# Patient Record
Sex: Male | Born: 1945
Health system: Southern US, Community
[De-identification: ages and names within clinical notes are randomized; demographics above are authoritative.]

## PROBLEM LIST (undated history)

## (undated) DIAGNOSIS — J189 Pneumonia, unspecified organism: Secondary | ICD-10-CM

## (undated) DIAGNOSIS — K219 Gastro-esophageal reflux disease without esophagitis: Secondary | ICD-10-CM

## (undated) DIAGNOSIS — E785 Hyperlipidemia, unspecified: Secondary | ICD-10-CM

## (undated) DIAGNOSIS — J449 Chronic obstructive pulmonary disease, unspecified: Secondary | ICD-10-CM

## (undated) DIAGNOSIS — I340 Nonrheumatic mitral (valve) insufficiency: Secondary | ICD-10-CM

## (undated) DIAGNOSIS — I4891 Unspecified atrial fibrillation: Secondary | ICD-10-CM

## (undated) DIAGNOSIS — C3490 Malignant neoplasm of unspecified part of unspecified bronchus or lung: Secondary | ICD-10-CM

## (undated) DIAGNOSIS — I509 Heart failure, unspecified: Secondary | ICD-10-CM

## (undated) HISTORY — PX: ABDOMINAL SURGERY: SHX537

---

## 2019-05-04 ENCOUNTER — Inpatient Hospital Stay (HOSPITAL_COMMUNITY)
Admission: EM | Admit: 2019-05-04 | Discharge: 2019-05-09 | DRG: 189 | Disposition: A | Discharge status: Hospice | Attending: Student | Admitting: Student

## 2019-05-04 DIAGNOSIS — J9601 Acute respiratory failure with hypoxia: Secondary | ICD-10-CM | POA: Diagnosis present

## 2019-05-04 DIAGNOSIS — J189 Pneumonia, unspecified organism: Secondary | ICD-10-CM | POA: Diagnosis present

## 2019-05-04 DIAGNOSIS — D631 Anemia in chronic kidney disease: Secondary | ICD-10-CM | POA: Diagnosis present

## 2019-05-04 DIAGNOSIS — Z79899 Other long term (current) drug therapy: Secondary | ICD-10-CM

## 2019-05-04 DIAGNOSIS — C7931 Secondary malignant neoplasm of brain: Secondary | ICD-10-CM | POA: Diagnosis present

## 2019-05-04 DIAGNOSIS — I248 Other forms of acute ischemic heart disease: Secondary | ICD-10-CM | POA: Diagnosis present

## 2019-05-04 DIAGNOSIS — Z87891 Personal history of nicotine dependence: Secondary | ICD-10-CM

## 2019-05-04 DIAGNOSIS — R7303 Prediabetes: Secondary | ICD-10-CM | POA: Diagnosis present

## 2019-05-04 DIAGNOSIS — E876 Hypokalemia: Secondary | ICD-10-CM | POA: Diagnosis not present

## 2019-05-04 DIAGNOSIS — Z515 Encounter for palliative care: Secondary | ICD-10-CM | POA: Diagnosis present

## 2019-05-04 DIAGNOSIS — R0602 Shortness of breath: Secondary | ICD-10-CM | POA: Diagnosis not present

## 2019-05-04 DIAGNOSIS — Z833 Family history of diabetes mellitus: Secondary | ICD-10-CM

## 2019-05-04 DIAGNOSIS — Z681 Body mass index (BMI) 19 or less, adult: Secondary | ICD-10-CM

## 2019-05-04 DIAGNOSIS — Z7902 Long term (current) use of antithrombotics/antiplatelets: Secondary | ICD-10-CM

## 2019-05-04 DIAGNOSIS — C3490 Malignant neoplasm of unspecified part of unspecified bronchus or lung: Secondary | ICD-10-CM | POA: Diagnosis present

## 2019-05-04 DIAGNOSIS — Z9981 Dependence on supplemental oxygen: Secondary | ICD-10-CM

## 2019-05-04 DIAGNOSIS — Z888 Allergy status to other drugs, medicaments and biological substances status: Secondary | ICD-10-CM

## 2019-05-04 DIAGNOSIS — N289 Disorder of kidney and ureter, unspecified: Secondary | ICD-10-CM

## 2019-05-04 DIAGNOSIS — D7589 Other specified diseases of blood and blood-forming organs: Secondary | ICD-10-CM | POA: Diagnosis present

## 2019-05-04 DIAGNOSIS — I5043 Acute on chronic combined systolic (congestive) and diastolic (congestive) heart failure: Secondary | ICD-10-CM | POA: Diagnosis present

## 2019-05-04 DIAGNOSIS — I480 Paroxysmal atrial fibrillation: Secondary | ICD-10-CM | POA: Diagnosis present

## 2019-05-04 DIAGNOSIS — Z880 Allergy status to penicillin: Secondary | ICD-10-CM

## 2019-05-04 DIAGNOSIS — R7989 Other specified abnormal findings of blood chemistry: Secondary | ICD-10-CM | POA: Diagnosis present

## 2019-05-04 DIAGNOSIS — Z66 Do not resuscitate: Secondary | ICD-10-CM

## 2019-05-04 DIAGNOSIS — D539 Nutritional anemia, unspecified: Secondary | ICD-10-CM | POA: Diagnosis present

## 2019-05-04 DIAGNOSIS — E785 Hyperlipidemia, unspecified: Secondary | ICD-10-CM | POA: Diagnosis present

## 2019-05-04 DIAGNOSIS — N1831 Chronic kidney disease, stage 3a: Secondary | ICD-10-CM | POA: Diagnosis present

## 2019-05-04 DIAGNOSIS — K219 Gastro-esophageal reflux disease without esophagitis: Secondary | ICD-10-CM | POA: Diagnosis present

## 2019-05-04 DIAGNOSIS — R64 Cachexia: Secondary | ICD-10-CM | POA: Diagnosis present

## 2019-05-04 DIAGNOSIS — M109 Gout, unspecified: Secondary | ICD-10-CM | POA: Diagnosis present

## 2019-05-04 DIAGNOSIS — Z8249 Family history of ischemic heart disease and other diseases of the circulatory system: Secondary | ICD-10-CM

## 2019-05-04 DIAGNOSIS — R778 Other specified abnormalities of plasma proteins: Secondary | ICD-10-CM

## 2019-05-04 DIAGNOSIS — J9621 Acute and chronic respiratory failure with hypoxia: Secondary | ICD-10-CM | POA: Diagnosis not present

## 2019-05-04 DIAGNOSIS — Z20822 Contact with and (suspected) exposure to covid-19: Secondary | ICD-10-CM | POA: Diagnosis present

## 2019-05-04 DIAGNOSIS — E43 Unspecified severe protein-calorie malnutrition: Secondary | ICD-10-CM | POA: Insufficient documentation

## 2019-05-04 DIAGNOSIS — J440 Chronic obstructive pulmonary disease with acute lower respiratory infection: Secondary | ICD-10-CM | POA: Diagnosis present

## 2019-05-04 DIAGNOSIS — N179 Acute kidney failure, unspecified: Secondary | ICD-10-CM | POA: Diagnosis present

## 2019-05-04 DIAGNOSIS — I712 Thoracic aortic aneurysm, without rupture: Secondary | ICD-10-CM | POA: Diagnosis present

## 2019-05-04 DIAGNOSIS — R079 Chest pain, unspecified: Secondary | ICD-10-CM | POA: Diagnosis present

## 2019-05-04 DIAGNOSIS — J441 Chronic obstructive pulmonary disease with (acute) exacerbation: Secondary | ICD-10-CM | POA: Diagnosis present

## 2019-05-04 DIAGNOSIS — I130 Hypertensive heart and chronic kidney disease with heart failure and stage 1 through stage 4 chronic kidney disease, or unspecified chronic kidney disease: Secondary | ICD-10-CM | POA: Diagnosis present

## 2019-05-04 HISTORY — DX: Chronic obstructive pulmonary disease, unspecified: J44.9

## 2019-05-04 HISTORY — DX: Nonrheumatic mitral (valve) insufficiency: I34.0

## 2019-05-04 HISTORY — DX: Malignant neoplasm of unspecified part of unspecified bronchus or lung: C34.90

## 2019-05-04 HISTORY — DX: Heart failure, unspecified: I50.9

## 2019-05-04 HISTORY — DX: Unspecified atrial fibrillation: I48.91

## 2019-05-04 HISTORY — DX: Hyperlipidemia, unspecified: E78.5

## 2019-05-04 HISTORY — DX: Gastro-esophageal reflux disease without esophagitis: K21.9

## 2019-05-04 HISTORY — DX: Pneumonia, unspecified organism: J18.9

## 2019-05-04 NOTE — ED Triage Notes (Signed)
Pt arrives via [facility] from home.   Pt is a hospice pt, family called about for acute SOB about 1hr ago.  Pt placed on CPAP sp02% 88%.  Pt given 2g of Mag, 125 of solumedrol, 0.3 mg of epi EPI, and a duoneb of 5 albuterol and 0.5 atrovent.   Pt arrives on NRB.   Pt speaks no english.

## 2019-05-04 NOTE — ED Provider Notes (Signed)
[facility] EMERGENCY DEPARTMENT Provider Note   CSN: [id number] Arrival date & time: 05/04/19  2324   History Chief Complaint  Patient presents with  . Shortness of Breath    Ross Roberts is a 74 y.o. male.  The history is provided by a relative. The history is limited by the condition of the patient (Acuity of condition).  Shortness of Breath He has history of COPD and lung cancer currently under hospice care.  Tonight, he appeared to aspirate while drinking some soda.  His son noted he normally spits a lot of stuff out and he was not spitting anything out.  He does seem to be somewhat better since arriving in the hospital.  He has lost approximately 20pounds.  He is DNR, but they have not discussed intubation and ventilator use.  No past medical history on file.  There are no problems to display for this patient.   ** The histories are not reviewed yet. Please review them in the "History" navigator section and refresh this [place].     No family history on file.  Social History   Tobacco Use  . Smoking status: Not on file  Substance Use Topics  . Alcohol use: Not on file  . Drug use: Not on file    Home Medications Prior to Admission medications   Not on File    Allergies    Patient has no allergy information on record.  Review of Systems   Review of Systems  Unable to perform ROS: Acuity of condition  Respiratory: Positive for shortness of breath.     Physical Exam Updated Vital Signs BP (!) 160/80   Pulse 95   Temp (!) 96.8 F (36 C) (Axillary)   Resp (!) 27   SpO2 98%   Physical Exam Vitals and nursing note reviewed.   Cachectic 74 year old male in mild to moderate respiratory distress. Vital signs are significant for elevated respiratory rate and elevated systolic blood pressure. Oxygen saturation is 98%, which is normal. Head is normocephalic and atraumatic. PERRLA, EOMI. Oropharynx is clear. Neck is nontender and  supple without adenopathy. JVD is present at 90 degrees. Back is nontender and there is no CVA tenderness. Lungs have diffuse inspiratory and expiratory rhonchi.  Breath sounds on the right are diminished compared with the left.  Rales are noted at the left base. Chest is nontender. Heart has regular rate and rhythm without murmur. Abdomen is soft, flat, nontender without masses or hepatosplenomegaly and peristalsis is normoactive. Extremities have no cyanosis or edema, full range of motion is present. Skin is warm and dry without rash. Neurologic: Mental status is normal, cranial nerves are intact, there are no motor or sensory deficits.  ED Results / Procedures / Treatments   Labs (all labs ordered are listed, but only abnormal results are displayed) Labs Reviewed - No data to display  EKG None  Radiology No results found.  Procedures Procedures (including critical care time)  Medications Ordered in ED Medications - No data to display  ED Course  I have reviewed the triage vital signs and the nursing notes.  Pertinent labs & imaging results that were available during my care of the patient were reviewed by me and considered in my medical decision making (see chart for details).  MDM Rules/Calculators/A&P Acute respiratory distress probably related to aspiration in setting of lung cancer.  Also, physical findings suggestive of heart failure.  He has no old records in the [facility] health system,  and no records are available through care everywhere, even though his original cancer diagnosis was through [facility] in [place].  Will check chest x-ray and screening labs.  ECG shows no acute changes.  After arrival in the ED, patient appeared stable.  It was taken off of his nonrebreather mask and put on nasal cannula at 4 L/min.  He maintain adequate oxygen saturation with this and then was reduced to 2 L/min.  Per his son, he uses an oxygen concentrator at home for oxygen at 2  L/min.  Chest x-ray is read as showing pneumonia.  However, patient does not clinically have pneumonia.  No antibiotics are initiated.  Labs show mild renal insufficiency, no prior results available.  Macrocytosis also identified.  BNP is normal but troponin is mildly elevated at 112.  He was kept in the ED for repeat troponin which has increased to 698.  He will need to be admitted for observation and trend troponins.  Case is discussed with Dr. Maudie Mercury of [facility] hospitalist, who agrees to admit the patient.  Final Clinical Impression(s) / ED Diagnoses Final diagnoses:  Shortness of breath  Elevated troponin  Renal insufficiency  Hypermagnesemia  Macrocytosis without anemia    Rx / DC Orders ED Discharge Orders    None       Delora Fuel, MD 90/90/30 [phone]

## 2019-05-05 ENCOUNTER — Observation Stay (HOSPITAL_BASED_OUTPATIENT_CLINIC_OR_DEPARTMENT_OTHER)

## 2019-05-05 ENCOUNTER — Other Ambulatory Visit: Payer: Self-pay

## 2019-05-05 ENCOUNTER — Encounter (HOSPITAL_COMMUNITY): Payer: Self-pay | Admitting: Internal Medicine

## 2019-05-05 ENCOUNTER — Emergency Department (HOSPITAL_COMMUNITY)

## 2019-05-05 DIAGNOSIS — N1831 Chronic kidney disease, stage 3a: Secondary | ICD-10-CM | POA: Diagnosis present

## 2019-05-05 DIAGNOSIS — R079 Chest pain, unspecified: Secondary | ICD-10-CM | POA: Diagnosis present

## 2019-05-05 DIAGNOSIS — R0602 Shortness of breath: Secondary | ICD-10-CM | POA: Diagnosis present

## 2019-05-05 DIAGNOSIS — I130 Hypertensive heart and chronic kidney disease with heart failure and stage 1 through stage 4 chronic kidney disease, or unspecified chronic kidney disease: Secondary | ICD-10-CM | POA: Diagnosis present

## 2019-05-05 DIAGNOSIS — Z66 Do not resuscitate: Secondary | ICD-10-CM | POA: Diagnosis present

## 2019-05-05 DIAGNOSIS — I519 Heart disease, unspecified: Secondary | ICD-10-CM

## 2019-05-05 DIAGNOSIS — J9601 Acute respiratory failure with hypoxia: Secondary | ICD-10-CM | POA: Diagnosis not present

## 2019-05-05 DIAGNOSIS — R64 Cachexia: Secondary | ICD-10-CM | POA: Diagnosis present

## 2019-05-05 DIAGNOSIS — R7989 Other specified abnormal findings of blood chemistry: Secondary | ICD-10-CM | POA: Diagnosis present

## 2019-05-05 DIAGNOSIS — J441 Chronic obstructive pulmonary disease with (acute) exacerbation: Secondary | ICD-10-CM | POA: Diagnosis present

## 2019-05-05 DIAGNOSIS — J9621 Acute and chronic respiratory failure with hypoxia: Secondary | ICD-10-CM | POA: Diagnosis present

## 2019-05-05 DIAGNOSIS — Z20822 Contact with and (suspected) exposure to covid-19: Secondary | ICD-10-CM | POA: Diagnosis present

## 2019-05-05 DIAGNOSIS — N289 Disorder of kidney and ureter, unspecified: Secondary | ICD-10-CM | POA: Diagnosis not present

## 2019-05-05 DIAGNOSIS — J189 Pneumonia, unspecified organism: Secondary | ICD-10-CM

## 2019-05-05 DIAGNOSIS — I5043 Acute on chronic combined systolic (congestive) and diastolic (congestive) heart failure: Secondary | ICD-10-CM | POA: Diagnosis present

## 2019-05-05 DIAGNOSIS — C7931 Secondary malignant neoplasm of brain: Secondary | ICD-10-CM | POA: Diagnosis present

## 2019-05-05 DIAGNOSIS — Z515 Encounter for palliative care: Secondary | ICD-10-CM | POA: Diagnosis present

## 2019-05-05 DIAGNOSIS — D631 Anemia in chronic kidney disease: Secondary | ICD-10-CM | POA: Diagnosis present

## 2019-05-05 DIAGNOSIS — R778 Other specified abnormalities of plasma proteins: Secondary | ICD-10-CM | POA: Diagnosis not present

## 2019-05-05 DIAGNOSIS — N179 Acute kidney failure, unspecified: Secondary | ICD-10-CM | POA: Diagnosis present

## 2019-05-05 DIAGNOSIS — I480 Paroxysmal atrial fibrillation: Secondary | ICD-10-CM | POA: Diagnosis present

## 2019-05-05 DIAGNOSIS — C3490 Malignant neoplasm of unspecified part of unspecified bronchus or lung: Secondary | ICD-10-CM | POA: Diagnosis present

## 2019-05-05 DIAGNOSIS — I248 Other forms of acute ischemic heart disease: Secondary | ICD-10-CM | POA: Diagnosis present

## 2019-05-05 DIAGNOSIS — E43 Unspecified severe protein-calorie malnutrition: Secondary | ICD-10-CM | POA: Diagnosis present

## 2019-05-05 DIAGNOSIS — Z681 Body mass index (BMI) 19 or less, adult: Secondary | ICD-10-CM | POA: Diagnosis not present

## 2019-05-05 DIAGNOSIS — Z9981 Dependence on supplemental oxygen: Secondary | ICD-10-CM | POA: Diagnosis not present

## 2019-05-05 DIAGNOSIS — J440 Chronic obstructive pulmonary disease with acute lower respiratory infection: Secondary | ICD-10-CM | POA: Diagnosis present

## 2019-05-05 DIAGNOSIS — D7589 Other specified diseases of blood and blood-forming organs: Secondary | ICD-10-CM | POA: Diagnosis present

## 2019-05-05 DIAGNOSIS — I712 Thoracic aortic aneurysm, without rupture: Secondary | ICD-10-CM | POA: Diagnosis present

## 2019-05-05 DIAGNOSIS — K219 Gastro-esophageal reflux disease without esophagitis: Secondary | ICD-10-CM | POA: Diagnosis present

## 2019-05-05 LAB — CBC WITH DIFFERENTIAL/PLATELET
Abs Immature Granulocytes: 0.05 10*3/uL (ref 0.00–0.07)
Basophils Absolute: 0.1 10*3/uL (ref 0.0–0.1)
Basophils Relative: 1 %
Eosinophils Absolute: 1.8 10*3/uL — ABNORMAL HIGH (ref 0.0–0.5)
Eosinophils Relative: 13 %
HCT: 39.8 % (ref 39.0–52.0)
Hemoglobin: 12.9 g/dL — ABNORMAL LOW (ref 13.0–17.0)
Immature Granulocytes: 0 %
Lymphocytes Relative: 35 %
Lymphs Abs: 4.7 10*3/uL — ABNORMAL HIGH (ref 0.7–4.0)
MCH: 33.4 pg (ref 26.0–34.0)
MCHC: 32.4 g/dL (ref 30.0–36.0)
MCV: 103.1 fL — ABNORMAL HIGH (ref 80.0–100.0)
Monocytes Absolute: 1.1 10*3/uL — ABNORMAL HIGH (ref 0.1–1.0)
Monocytes Relative: 8 %
Neutro Abs: 5.7 10*3/uL (ref 1.7–7.7)
Neutrophils Relative %: 43 %
Platelets: 347 10*3/uL (ref 150–400)
RBC: 3.86 MIL/uL — ABNORMAL LOW (ref 4.22–5.81)
RDW: 14.8 % (ref 11.5–15.5)
WBC: 13.4 10*3/uL — ABNORMAL HIGH (ref 4.0–10.5)
nRBC: 0 % (ref 0.0–0.2)

## 2019-05-05 LAB — COMPREHENSIVE METABOLIC PANEL
ALT: 14 U/L (ref 0–44)
ALT: 13 U/L (ref 0–44)
AST: 33 U/L (ref 15–41)
AST: 24 U/L (ref 15–41)
Albumin: 3.3 g/dL — ABNORMAL LOW (ref 3.5–5.0)
Albumin: 2.9 g/dL — ABNORMAL LOW (ref 3.5–5.0)
Alkaline Phosphatase: 70 U/L (ref 38–126)
Alkaline Phosphatase: 55 U/L (ref 38–126)
Anion gap: 14 (ref 5–15)
Anion gap: 13 (ref 5–15)
BUN: 27 mg/dL — ABNORMAL HIGH (ref 8–23)
BUN: 28 mg/dL — ABNORMAL HIGH (ref 8–23)
CO2: 27 mmol/L (ref 22–32)
CO2: 23 mmol/L (ref 22–32)
Calcium: 9 mg/dL (ref 8.9–10.3)
Calcium: 8.6 mg/dL — ABNORMAL LOW (ref 8.9–10.3)
Chloride: 99 mmol/L (ref 98–111)
Chloride: 103 mmol/L (ref 98–111)
Creatinine, Ser: 1.81 mg/dL — ABNORMAL HIGH (ref 0.61–1.24)
Creatinine, Ser: 1.81 mg/dL — ABNORMAL HIGH (ref 0.61–1.24)
GFR calc Af Amer: 42 mL/min — ABNORMAL LOW (ref >60)
GFR calc Af Amer: 42 mL/min — ABNORMAL LOW (ref >60)
GFR calc non Af Amer: 36 mL/min — ABNORMAL LOW (ref >60)
GFR calc non Af Amer: 36 mL/min — ABNORMAL LOW (ref >60)
Glucose, Bld: 170 mg/dL — ABNORMAL HIGH (ref 70–99)
Glucose, Bld: 140 mg/dL — ABNORMAL HIGH (ref 70–99)
Potassium: 3.7 mmol/L (ref 3.5–5.1)
Potassium: 3.9 mmol/L (ref 3.5–5.1)
Sodium: 140 mmol/L (ref 135–145)
Sodium: 139 mmol/L (ref 135–145)
Total Bilirubin: 0.9 mg/dL (ref 0.3–1.2)
Total Bilirubin: 0.7 mg/dL (ref 0.3–1.2)
Total Protein: 6.5 g/dL (ref 6.5–8.1)
Total Protein: 6 g/dL — ABNORMAL LOW (ref 6.5–8.1)

## 2019-05-05 LAB — RESPIRATORY PANEL BY RT PCR (FLU A&B, COVID)
Influenza A by PCR: NEGATIVE
Influenza B by PCR: NEGATIVE
SARS Coronavirus 2 by RT PCR: NEGATIVE

## 2019-05-05 LAB — CBC
HCT: 36.7 % — ABNORMAL LOW (ref 39.0–52.0)
Hemoglobin: 11.8 g/dL — ABNORMAL LOW (ref 13.0–17.0)
MCH: 33 pg (ref 26.0–34.0)
MCHC: 32.2 g/dL (ref 30.0–36.0)
MCV: 102.5 fL — ABNORMAL HIGH (ref 80.0–100.0)
Platelets: 271 10*3/uL (ref 150–400)
RBC: 3.58 MIL/uL — ABNORMAL LOW (ref 4.22–5.81)
RDW: 14.6 % (ref 11.5–15.5)
WBC: 11 10*3/uL — ABNORMAL HIGH (ref 4.0–10.5)
nRBC: 0 % (ref 0.0–0.2)

## 2019-05-05 LAB — LIPID PANEL
Cholesterol: 185 mg/dL (ref 0–200)
HDL: 41 mg/dL (ref >40)
LDL Cholesterol: 128 mg/dL — ABNORMAL HIGH (ref 0–99)
Total CHOL/HDL Ratio: 4.5 RATIO
Triglycerides: 78 mg/dL (ref <150)
VLDL: 16 mg/dL (ref 0–40)

## 2019-05-05 LAB — CK TOTAL AND CKMB (NOT AT ARMC)
CK, MB: 2.7 ng/mL (ref 0.5–5.0)
Relative Index: INVALID (ref 0.0–2.5)
Total CK: 50 U/L (ref 49–397)

## 2019-05-05 LAB — ECHOCARDIOGRAM COMPLETE

## 2019-05-05 LAB — TROPONIN I (HIGH SENSITIVITY)
Troponin I (High Sensitivity): 112 ng/L (ref <18)
Troponin I (High Sensitivity): 698 ng/L (ref <18)
Troponin I (High Sensitivity): 763 ng/L (ref <18)

## 2019-05-05 LAB — HEMOGLOBIN A1C
Hgb A1c MFr Bld: 6.2 % — ABNORMAL HIGH (ref 4.8–5.6)
Mean Plasma Glucose: 131.24 mg/dL

## 2019-05-05 LAB — MAGNESIUM: Magnesium: 3.5 mg/dL — ABNORMAL HIGH (ref 1.7–2.4)

## 2019-05-05 LAB — BRAIN NATRIURETIC PEPTIDE: B Natriuretic Peptide: 69.5 pg/mL (ref 0.0–100.0)

## 2019-05-05 LAB — HIV ANTIBODY (ROUTINE TESTING W REFLEX): HIV Screen 4th Generation wRfx: NONREACTIVE

## 2019-05-05 MED ORDER — VANCOMYCIN HCL 750 MG/150ML IV SOLN
750.0000 mg | INTRAVENOUS | Status: DC
  Administered 2019-05-05 – 2019-05-07 (×2): 750 mg via INTRAVENOUS
  Filled 2019-05-05 (×2): qty 150

## 2019-05-05 MED ORDER — LORAZEPAM 0.5 MG PO TABS: 0.5000 mg | ORAL_TABLET | ORAL | Status: DC | PRN

## 2019-05-05 MED ORDER — SODIUM CHLORIDE 0.9 % IV SOLN: INTRAVENOUS | Status: DC

## 2019-05-05 MED ORDER — ALBUTEROL SULFATE (2.5 MG/3ML) 0.083% IN NEBU
2.5000 mg | INHALATION_SOLUTION | RESPIRATORY_TRACT | Status: DC | PRN
  Administered 2019-05-05 – 2019-05-07 (×7): 2.5 mg via RESPIRATORY_TRACT
  Filled 2019-05-05 (×7): qty 3

## 2019-05-05 MED ORDER — FAMOTIDINE 20 MG PO TABS
20.0000 mg | ORAL_TABLET | Freq: Every day | ORAL | Status: DC
  Administered 2019-05-05 – 2019-05-08 (×4): 20 mg via ORAL
  Filled 2019-05-05 (×4): qty 1

## 2019-05-05 MED ORDER — FUROSEMIDE 40 MG PO TABS
40.0000 mg | ORAL_TABLET | Freq: Every day | ORAL | Status: DC
  Administered 2019-05-05 – 2019-05-06 (×2): 40 mg via ORAL
  Filled 2019-05-05 (×2): qty 1

## 2019-05-05 MED ORDER — GABAPENTIN 100 MG PO CAPS: 100.0000 mg | ORAL_CAPSULE | Freq: Every evening | ORAL | Status: DC | PRN

## 2019-05-05 MED ORDER — NITROGLYCERIN 0.4 MG SL SUBL: 0.4000 mg | SUBLINGUAL_TABLET | SUBLINGUAL | Status: DC | PRN

## 2019-05-05 MED ORDER — ALBUTEROL SULFATE HFA 108 (90 BASE) MCG/ACT IN AERS
2.0000 | INHALATION_SPRAY | RESPIRATORY_TRACT | Status: DC | PRN
  Filled 2019-05-05: qty 6.7

## 2019-05-05 MED ORDER — APIXABAN 2.5 MG PO TABS
2.5000 mg | ORAL_TABLET | Freq: Two times a day (BID) | ORAL | Status: DC
  Administered 2019-05-05 – 2019-05-09 (×9): 2.5 mg via ORAL
  Filled 2019-05-05 (×10): qty 1

## 2019-05-05 MED ORDER — ALLOPURINOL 100 MG PO TABS
100.0000 mg | ORAL_TABLET | Freq: Every day | ORAL | Status: DC
  Administered 2019-05-05 – 2019-05-09 (×5): 100 mg via ORAL
  Filled 2019-05-05 (×6): qty 1

## 2019-05-05 MED ORDER — PANTOPRAZOLE SODIUM 40 MG PO TBEC
40.0000 mg | DELAYED_RELEASE_TABLET | Freq: Every day | ORAL | Status: DC
  Administered 2019-05-05 – 2019-05-09 (×5): 40 mg via ORAL
  Filled 2019-05-05 (×5): qty 1

## 2019-05-05 MED ORDER — SUCRALFATE 1 GM/10ML PO SUSP
1.0000 g | Freq: Three times a day (TID) | ORAL | Status: DC
  Administered 2019-05-05 – 2019-05-09 (×13): 1 g via ORAL
  Filled 2019-05-05 (×14): qty 10

## 2019-05-05 MED ORDER — LEVOFLOXACIN IN D5W 500 MG/100ML IV SOLN
500.0000 mg | INTRAVENOUS | Status: DC
  Administered 2019-05-05: 07:00:00 500 mg via INTRAVENOUS
  Filled 2019-05-05: qty 100

## 2019-05-05 MED ORDER — MIRTAZAPINE 7.5 MG PO TABS
7.5000 mg | ORAL_TABLET | Freq: Every day | ORAL | Status: DC
  Administered 2019-05-05 – 2019-05-08 (×4): 7.5 mg via ORAL
  Filled 2019-05-05 (×5): qty 1

## 2019-05-05 MED ORDER — MONTELUKAST SODIUM 10 MG PO TABS
10.0000 mg | ORAL_TABLET | Freq: Every day | ORAL | Status: DC
  Administered 2019-05-05 – 2019-05-08 (×4): 10 mg via ORAL
  Filled 2019-05-05 (×4): qty 1

## 2019-05-05 MED ORDER — CARVEDILOL 25 MG PO TABS
25.0000 mg | ORAL_TABLET | Freq: Two times a day (BID) | ORAL | Status: DC
  Administered 2019-05-05 – 2019-05-07 (×6): 25 mg via ORAL
  Filled 2019-05-05 (×7): qty 1

## 2019-05-05 MED ORDER — BENZONATATE 100 MG PO CAPS: 100.0000 mg | ORAL_CAPSULE | Freq: Three times a day (TID) | ORAL | Status: DC | PRN

## 2019-05-05 MED ORDER — MORPHINE SULFATE 15 MG PO TABS: 7.5000 mg | ORAL_TABLET | Freq: Three times a day (TID) | ORAL | Status: DC | PRN

## 2019-05-05 NOTE — ED Notes (Signed)
Breakfast ordered 

## 2019-05-05 NOTE — Progress Notes (Signed)
  Echocardiogram 2D Echocardiogram has been performed.  Ross Roberts 05/05/2019, 1:47 PM

## 2019-05-05 NOTE — Progress Notes (Signed)
Care started yesterday in the emergency room patient presented from home via EMS and he was admitted early this morning after midnight by my partner and colleague Dr. Jani Gravel and I am in current agreement with his assessment and plan.  Additional changes of plan of care been made accordingly.  The patient is a 74 year old [place] male who only speaks Telugu who is on hospice currently and does not know that he has lung cancer his family is hid it from him.  His past medical history also includes GERD, hypertension, CKD stage III, moderate mitral regurgitation, chronic systolic CHF with a EF of 40%, thoracic aortic aneurysm, history of COPD on 2 L of home oxygen and other comorbidities who presented with hypoxia.  Patient's son stated that the patient's pulse ox was 88% and he noted a slight chronic cough which is nonproductive.  Patient states that he did develop intermittent chest pain across his chest since December 2020 and he states it worsened yesterday and patient described as a dull pressure with something sitting on his chest.  He presented and he was given 2 mg of magnesium, Solu-Medrol and an epinephrine as well as a DuoNeb enroute.  He was admitted for acute on chronic hypoxic respiratory failure likely secondary to pneumonia as well as chest pain with elevated troponin rule out ACS.    Patient states that he is doing a little bit better now but still has some intermittent chest pain and describe it as "the pain in your little pinky".  His troponins were slightly elevated and worsening in the EKG was done and was relatively unremarkable.  We will order an echocardiogram and discussed with cardiology.  I spoke with Dr. Dani Gobble Croitoru who states that he will evaluate the patient and EKG and wait for the echo results before deciding to formally consult on the patient.  Patient's IV fluids were stopped and will continue his 40 mg of p.o. Lasix for systolic CHF.  Patient is anticoagulated with apixaban  for his proximal atrial fibrillation.  We will continue to treat pneumonia and de-escalate antibiotics as necessary and checking MRSA.  We will continue symptomatic treatment and repeat chest x-ray in the a.m.  We will continue monitoring patient's clinical response to intervention and if his echo is significantly abnormal Cardiology will formally consult and will follow the recommendations.
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
## 2019-05-05 NOTE — Progress Notes (Addendum)
ANTICOAGULATION CONSULT NOTE - Initial Consult  Pharmacy Consult for Apixaban Indication: atrial fibrillation  Allergies  Allergen Reactions  . Hydralazine Hcl Rash  . Isosorbide Rash  . Penicillins Rash    Did it involve swelling of the face/tongue/throat, SOB, or low BP? Unknown Did it involve sudden or severe rash/hives, skin peeling, or any reaction on the inside of your mouth or nose? Yes Did you need to seek medical attention at a hospital or doctor's office? Yes When did it last happen?2015 If all above answers are "NO", may proceed with cephalosporin use.     Vital Signs: Temp: 96.8 F (36 C) (02/13 2332) Temp Source: Axillary (02/13 2332) BP: 110/50 (02/14 0500) Pulse Rate: 67 (02/14 0500)  Labs: Recent Labs    05/04/19 2335 05/05/19 0147  HGB 12.9*  --   HCT 39.8  --   PLT 347  --   CREATININE 1.81*  --   CKTOTAL  --  50  CKMB  --  2.7  TROPONINIHS 112* 698*    CrCl cannot be calculated (Unknown ideal weight.).   Medical History: Past Medical History:  Diagnosis Date  . A-fib ([place])   . CHF (congestive heart failure) (HCC)    EF 40%  . COPD (chronic obstructive pulmonary disease) ([place])   . GERD (gastroesophageal reflux disease)   . Hyperlipidemia   . Lung cancer ([place])    per son  . Mitral regurgitation    moderate  . Pneumonia    post obstructive   Assessment: 74 y/o M with shortness of breath, pt on apixaban PTA for afib, apixaban dose PTA is 5 mg BID. Pt currently meets criteria for dose reduction of 2.5 mg BID based on Scr >1.5 and body weight <60 kg. Hgb 12.9.  Goal of Therapy:  Monitor platelets by anticoagulation protocol: Yes   Plan:  Apixaban 2.5 mg BID Daily CBC while inpatient Monitor for bleeding  Narda Bonds, PharmD, BCPS Clinical Pharmacist Phone: [phone]

## 2019-05-05 NOTE — ED Notes (Signed)
2 attempts for blood draw and successful but veins blown. RN Eugene Garnet informed

## 2019-05-05 NOTE — Progress Notes (Signed)
Chaplain responded to spiritual consult for pt request of  prayer.  Ross Roberts was present with pt and provided translation. Pt said he did not request prayer, but accepted a short blessing nonetheless. Chaplain offered encouragement and emotional support.   Please contact if further services are needed.  Ross Roberts, Sylvia     05/05/19 1617  Clinical Encounter Type  Visited With Patient and family together  Visit Type Initial  Referral From  (Spiritual Consult)  Consult/Referral To Chaplain

## 2019-05-05 NOTE — Progress Notes (Signed)
Pharmacy Antibiotic Note  Ross Roberts is a 74 y.o. male admitted on 05/04/2019 with pneumonia.  Pharmacy has been consulted for Vancomycin/Levaquin dosing. WBC 11. Noted renal dysfunction.  Plan: Vancomycin 750 mg IV q48h >>Estimated AUC:453  Levaquin 500 mg IV q48h Trend WBC, temp, renal function  F/U infectious work-up Drug levels as indicated    Temp (24hrs), Avg:96.8 F (36 C), Min:96.8 F (36 C), Max:96.8 F (36 C)  Recent Labs  Lab 05/04/19 2335 05/05/19 0508  WBC 13.4* 11.0*  CREATININE 1.81*  --     CrCl cannot be calculated (Unknown ideal weight.).    Allergies  Allergen Reactions  . Hydralazine Hcl Rash  . Isosorbide Rash  . Penicillins Rash    Did it involve swelling of the face/tongue/throat, SOB, or low BP? Unknown Did it involve sudden or severe rash/hives, skin peeling, or any reaction on the inside of your mouth or nose? Yes Did you need to seek medical attention at a hospital or doctor's office? Yes When did it last happen?2015 If all above answers are "NO", may proceed with cephalosporin use.     Narda Bonds, PharmD, BCPS Clinical Pharmacist Phone: [phone]
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
## 2019-05-06 ENCOUNTER — Inpatient Hospital Stay (HOSPITAL_COMMUNITY)

## 2019-05-06 DIAGNOSIS — R778 Other specified abnormalities of plasma proteins: Secondary | ICD-10-CM

## 2019-05-06 DIAGNOSIS — N289 Disorder of kidney and ureter, unspecified: Secondary | ICD-10-CM

## 2019-05-06 DIAGNOSIS — R079 Chest pain, unspecified: Secondary | ICD-10-CM

## 2019-05-06 DIAGNOSIS — R0602 Shortness of breath: Secondary | ICD-10-CM

## 2019-05-06 DIAGNOSIS — Z66 Do not resuscitate: Secondary | ICD-10-CM

## 2019-05-06 DIAGNOSIS — E43 Unspecified severe protein-calorie malnutrition: Secondary | ICD-10-CM

## 2019-05-06 LAB — COMPREHENSIVE METABOLIC PANEL
ALT: 13 U/L (ref 0–44)
AST: 19 U/L (ref 15–41)
Albumin: 2.7 g/dL — ABNORMAL LOW (ref 3.5–5.0)
Alkaline Phosphatase: 55 U/L (ref 38–126)
Anion gap: 14 (ref 5–15)
BUN: 39 mg/dL — ABNORMAL HIGH (ref 8–23)
CO2: 24 mmol/L (ref 22–32)
Calcium: 9 mg/dL (ref 8.9–10.3)
Chloride: 105 mmol/L (ref 98–111)
Creatinine, Ser: 1.74 mg/dL — ABNORMAL HIGH (ref 0.61–1.24)
GFR calc Af Amer: 44 mL/min — ABNORMAL LOW (ref >60)
GFR calc non Af Amer: 38 mL/min — ABNORMAL LOW (ref >60)
Glucose, Bld: 146 mg/dL — ABNORMAL HIGH (ref 70–99)
Potassium: 3.8 mmol/L (ref 3.5–5.1)
Sodium: 143 mmol/L (ref 135–145)
Total Bilirubin: 0.6 mg/dL (ref 0.3–1.2)
Total Protein: 5.4 g/dL — ABNORMAL LOW (ref 6.5–8.1)

## 2019-05-06 LAB — CBC WITH DIFFERENTIAL/PLATELET
Abs Immature Granulocytes: 0.08 10*3/uL — ABNORMAL HIGH (ref 0.00–0.07)
Basophils Absolute: 0 10*3/uL (ref 0.0–0.1)
Basophils Relative: 0 %
Eosinophils Absolute: 0 10*3/uL (ref 0.0–0.5)
Eosinophils Relative: 0 %
HCT: 33.5 % — ABNORMAL LOW (ref 39.0–52.0)
Hemoglobin: 10.9 g/dL — ABNORMAL LOW (ref 13.0–17.0)
Immature Granulocytes: 1 %
Lymphocytes Relative: 5 %
Lymphs Abs: 0.8 10*3/uL (ref 0.7–4.0)
MCH: 33 pg (ref 26.0–34.0)
MCHC: 32.5 g/dL (ref 30.0–36.0)
MCV: 101.5 fL — ABNORMAL HIGH (ref 80.0–100.0)
Monocytes Absolute: 0.5 10*3/uL (ref 0.1–1.0)
Monocytes Relative: 4 %
Neutro Abs: 13.9 10*3/uL — ABNORMAL HIGH (ref 1.7–7.7)
Neutrophils Relative %: 90 %
Platelets: 276 10*3/uL (ref 150–400)
RBC: 3.3 MIL/uL — ABNORMAL LOW (ref 4.22–5.81)
RDW: 14.5 % (ref 11.5–15.5)
WBC: 15.4 10*3/uL — ABNORMAL HIGH (ref 4.0–10.5)
nRBC: 0 % (ref 0.0–0.2)

## 2019-05-06 LAB — MRSA PCR SCREENING: MRSA by PCR: NEGATIVE

## 2019-05-06 LAB — MAGNESIUM: Magnesium: 2.3 mg/dL (ref 1.7–2.4)

## 2019-05-06 LAB — PHOSPHORUS: Phosphorus: 3.3 mg/dL (ref 2.5–4.6)

## 2019-05-06 MED ORDER — IPRATROPIUM BROMIDE 0.02 % IN SOLN
0.5000 mg | Freq: Three times a day (TID) | RESPIRATORY_TRACT | Status: DC
  Administered 2019-05-07: 0.5 mg via RESPIRATORY_TRACT
  Filled 2019-05-06: qty 2.5

## 2019-05-06 MED ORDER — LEVALBUTEROL HCL 0.63 MG/3ML IN NEBU
0.6300 mg | INHALATION_SOLUTION | Freq: Four times a day (QID) | RESPIRATORY_TRACT | Status: DC
  Administered 2019-05-06: 0.63 mg via RESPIRATORY_TRACT
  Filled 2019-05-06: qty 3

## 2019-05-06 MED ORDER — FUROSEMIDE 10 MG/ML IJ SOLN
INTRAMUSCULAR | Status: AC
  Administered 2019-05-06: 40 mg
  Filled 2019-05-06: qty 4

## 2019-05-06 MED ORDER — RANOLAZINE ER 500 MG PO TB12
500.0000 mg | ORAL_TABLET | Freq: Two times a day (BID) | ORAL | Status: DC
  Administered 2019-05-06 – 2019-05-07 (×3): 500 mg via ORAL
  Filled 2019-05-06 (×3): qty 1

## 2019-05-06 MED ORDER — ENSURE ENLIVE PO LIQD
237.0000 mL | Freq: Three times a day (TID) | ORAL | Status: DC
  Administered 2019-05-07 – 2019-05-09 (×6): 237 mL via ORAL

## 2019-05-06 MED ORDER — FUROSEMIDE 10 MG/ML IJ SOLN: 40.0000 mg | Freq: Once | INTRAMUSCULAR | Status: AC

## 2019-05-06 MED ORDER — FUROSEMIDE 20 MG PO TABS
20.0000 mg | ORAL_TABLET | Freq: Every day | ORAL | Status: DC
  Administered 2019-05-07 – 2019-05-09 (×3): 20 mg via ORAL
  Filled 2019-05-06 (×3): qty 1

## 2019-05-06 MED ORDER — SODIUM CHLORIDE 0.9 % IV SOLN
2.0000 g | INTRAVENOUS | Status: DC
  Administered 2019-05-06 – 2019-05-08 (×3): 2 g via INTRAVENOUS
  Filled 2019-05-06 (×4): qty 2

## 2019-05-06 MED ORDER — GUAIFENESIN ER 600 MG PO TB12
1200.0000 mg | ORAL_TABLET | Freq: Two times a day (BID) | ORAL | Status: DC
  Administered 2019-05-06 – 2019-05-09 (×6): 1200 mg via ORAL
  Filled 2019-05-06 (×6): qty 2

## 2019-05-06 MED ORDER — ADULT MULTIVITAMIN W/MINERALS CH
1.0000 | ORAL_TABLET | Freq: Every day | ORAL | Status: DC
  Administered 2019-05-06 – 2019-05-09 (×4): 1 via ORAL
  Filled 2019-05-06 (×3): qty 1

## 2019-05-06 MED ORDER — LEVALBUTEROL HCL 0.63 MG/3ML IN NEBU
0.6300 mg | INHALATION_SOLUTION | Freq: Three times a day (TID) | RESPIRATORY_TRACT | Status: DC
  Administered 2019-05-07: 0.63 mg via RESPIRATORY_TRACT
  Filled 2019-05-06: qty 3

## 2019-05-06 MED ORDER — IPRATROPIUM BROMIDE 0.02 % IN SOLN
0.5000 mg | Freq: Four times a day (QID) | RESPIRATORY_TRACT | Status: DC
  Administered 2019-05-06: 0.5 mg via RESPIRATORY_TRACT
  Filled 2019-05-06: qty 2.5

## 2019-05-06 MED ORDER — ONDANSETRON HCL 4 MG/2ML IJ SOLN
4.0000 mg | Freq: Four times a day (QID) | INTRAMUSCULAR | Status: DC | PRN
  Administered 2019-05-08: 4 mg via INTRAVENOUS
  Filled 2019-05-06: qty 2

## 2019-05-06 NOTE — Progress Notes (Signed)
Progress Note  Patient Name: Ross Roberts Date of Encounter: 05/06/2019  Primary Cardiologist: Dr. Lamar Blinks of [facility]  Subjective   Continue to have difficulty breathing and chest discomfort associated with breathing.   Inpatient Medications    Scheduled Meds: . allopurinol  100 mg Oral Daily  . apixaban  2.5 mg Oral BID  . carvedilol  25 mg Oral BID WC  . famotidine  20 mg Oral QHS  . furosemide  40 mg Oral Daily  . mirtazapine  7.5 mg Oral QHS  . montelukast  10 mg Oral QHS  . pantoprazole  40 mg Oral Daily  . sucralfate  1 g Oral TID WC   Continuous Infusions: . levofloxacin (LEVAQUIN) IV Stopped (05/05/19 1740)  . vancomycin Stopped (05/05/19 0803)   PRN Meds: albuterol, benzonatate, gabapentin, LORazepam, morphine, nitroGLYCERIN   Vital Signs    Vitals:   05/05/19 2009 05/05/19 2258 05/05/19 2349 05/06/19 0400  BP:   122/81 (!) 118/45  Pulse: 94 91 87 80  Resp: (!) 30 (!) 22 15 18   Temp:   98.2 F (36.8 C) 98.2 F (36.8 C)  TempSrc:   Oral Oral  SpO2: 99% 94% 94% 98%  Weight:    42.6 kg    Intake/Output Summary (Last 24 hours) at 05/06/2019 0756 Last data filed at 05/06/2019 0142 Gross per 24 hour  Intake 960 ml  Output 1100 ml  Net -140 ml   Last 3 Weights 05/06/2019  Weight (lbs) 94 lb  Weight (kg) 42.638 kg      Telemetry    Sinus tachycardia - Personally Reviewed  ECG    05/05/2019 NSR without significant ST-T wave changes - Personally Reviewed  Physical Exam   GEN: frail, cachectic.  Neck: No JVD Cardiac: RRR, no murmurs, rubs, or gallops.  Respiratory: diminished breath sound with rhonchi GI: Soft, nontender, non-distended  MS: No edema; No deformity. Neuro:  Nonfocal  Psych: Normal affect   Labs    High Sensitivity Troponin:   Recent Labs  Lab 05/04/19 2335 05/05/19 0147 05/05/19 0508  TROPONINIHS 112* 698* 763*      Chemistry Recent Labs  Lab 05/04/19 2335 05/05/19 0508 05/06/19 0329  NA 140 139 143  K  3.7 3.9 3.8  CL 99 103 105  CO2 27 23 24   GLUCOSE 170* 140* 146*  BUN 27* 28* 39*  CREATININE 1.81* 1.81* 1.74*  CALCIUM 9.0 8.6* 9.0  PROT 6.5 6.0* 5.4*  ALBUMIN 3.3* 2.9* 2.7*  AST 33 24 19  ALT 14 13 13   ALKPHOS 70 55 55  BILITOT 0.9 0.7 0.6  GFRNONAA 36* 36* 38*  GFRAA 42* 42* 44*  ANIONGAP 14 13 14      Hematology Recent Labs  Lab 05/04/19 2335 05/05/19 0508 05/06/19 0329  WBC 13.4* 11.0* 15.4*  RBC 3.86* 3.58* 3.30*  HGB 12.9* 11.8* 10.9*  HCT 39.8 36.7* 33.5*  MCV 103.1* 102.5* 101.5*  MCH 33.4 33.0 33.0  MCHC 32.4 32.2 32.5  RDW 14.8 14.6 14.5  PLT 347 271 276    BNP Recent Labs  Lab 05/04/19 2335  BNP 69.5     DDimer No results for input(s): DDIMER in the last 168 hours.   Radiology    DG Chest Port 1 View  Result Date: 05/05/2019 CLINICAL DATA:  Shortness of breath EXAM: PORTABLE CHEST 1 VIEW COMPARISON:  None. FINDINGS: Artifact overlies the chest. Chronic scoliosis of the spine. Mild cardiomegaly. Aortic atherosclerosis and tortuosity. Abnormal bilateral patchy pulmonary  density, most pronounced in the right lower lobe. Findings most consistent with widespread bronchopneumonia. This could be bacterial or viral. There is also some possibility the findings could reflect acute heart failure. IMPRESSION: Widespread pneumonia favored, most severe in the right lower lobe. This could be bacterial or viral. Some component pulmonary edema/fluid overload could be present. Electronically Signed   By: Nelson Chimes M.D.   On: 05/05/2019 00:39   ECHOCARDIOGRAM COMPLETE  Result Date: 05/05/2019    ECHOCARDIOGRAM REPORT   Patient Name:   Ross Roberts Date of Exam: 05/05/2019 Medical Rec #:  [id number]        Height: Accession #:    [id number]       Weight: Date of Birth:  1945/04/05       BSA: Patient Age:    74 years         BP:           127/85 mmHg Patient Gender: M                HR:           76 bpm. Exam Location:  Inpatient Procedure: 2D Echo, Cardiac Doppler  and Color Doppler Indications:    Chest pain  History:        Patient has no prior history of Echocardiogram examinations.                 CHF, COPD, Arrythmias:Atrial Fibrillation; Risk                 Factors:Hypertension. Lung cancer, pneumoina.  Sonographer:    Dustin Flock Referring Phys: [place]  1. Left ventricular ejection fraction, by estimation, is 40 to 45%. The left ventricle has mildly decreased function. The left ventricle demonstrates global hypokinesis. Left ventricular diastolic parameters are consistent with Grade I diastolic dysfunction (impaired relaxation).  2. Right ventricular systolic function is normal. The right ventricular size is normal.  3. The mitral valve is normal in structure and function. No evidence of mitral valve regurgitation. No evidence of mitral stenosis.  4. The aortic valve is normal in structure and function. Aortic valve regurgitation is mild. Mild aortic valve sclerosis is present, with no evidence of aortic valve stenosis.  5. The inferior vena cava is normal in size with greater than 50% respiratory variability, suggesting right atrial pressure of 3 mmHg. Comparison(s): No prior Echocardiogram. FINDINGS  Left Ventricle: Left ventricular ejection fraction, by estimation, is 40 to 45%. The left ventricle has mildly decreased function. The left ventricle demonstrates global hypokinesis. The left ventricular internal cavity size was normal in size. There is  no left ventricular hypertrophy. Left ventricular diastolic parameters are consistent with Grade I diastolic dysfunction (impaired relaxation). Normal left ventricular filling pressure. Right Ventricle: The right ventricular size is normal. No increase in right ventricular wall thickness. Right ventricular systolic function is normal. Left Atrium: Left atrial size was normal in size. Right Atrium: Right atrial size was normal in size. Pericardium: There is no evidence of pericardial effusion.  Mitral Valve: The mitral valve is normal in structure and function. Normal mobility of the mitral valve leaflets. No evidence of mitral valve regurgitation. No evidence of mitral valve stenosis. Tricuspid Valve: The tricuspid valve is normal in structure. Tricuspid valve regurgitation is not demonstrated. No evidence of tricuspid stenosis. Aortic Valve: The aortic valve is normal in structure and function. Aortic valve regurgitation is mild. Aortic regurgitation PHT measures 595 msec. Mild aortic valve sclerosis is  present, with no evidence of aortic valve stenosis. Pulmonic Valve: The pulmonic valve was not well visualized. Pulmonic valve regurgitation is not visualized. No evidence of pulmonic stenosis. Aorta: The aortic root is normal in size and structure. Venous: The inferior vena cava is normal in size with greater than 50% respiratory variability, suggesting right atrial pressure of 3 mmHg. IAS/Shunts: No atrial level shunt detected by color flow Doppler.  LEFT VENTRICLE PLAX 2D LVIDd:         4.35 cm  Diastology LVIDs:         3.04 cm  LV e' lateral:   4.90 cm/s LV PW:         1.05 cm  LV E/e' lateral: 8.1 LV IVS:        1.08 cm  LV e' medial:    4.57 cm/s LVOT diam:     2.20 cm  LV E/e' medial:  8.7 LV SV:         72.23 ml LVOT Area:     3.80 cm  RIGHT VENTRICLE RV Basal diam:  1.85 cm RV S prime:     11.30 cm/s TAPSE (M-mode): 2.0 cm LEFT ATRIUM             RIGHT ATRIUM LA diam:        2.70 cm RA Area:     11.00 cm LA Vol (A2C):   26.6 ml RA Volume:   23.20 ml LA Vol (A4C):   27.6 ml LA Biplane Vol: 27.4 ml  AORTIC VALVE LVOT Vmax:   90.10 cm/s LVOT Vmean:  58.900 cm/s LVOT VTI:    0.190 m AI PHT:      595 msec  AORTA Ao Root diam: 3.30 cm MITRAL VALVE MV Area (PHT): 2.82 cm    SHUNTS MV Decel Time: 269 msec    Systemic VTI:  0.19 m MV E velocity: 39.65 cm/s  Systemic Diam: 2.20 cm MV A velocity: 88.70 cm/s MV E/A ratio:  0.45 Mihai Croitoru MD Electronically signed by Sanda Klein MD Signature  Date/Time: 05/05/2019/2:02:08 PM    Final     Cardiac Studies   Echo 05/05/2019 1. Left ventricular ejection fraction, by estimation, is 40 to 45%. The  left ventricle has mildly decreased function. The left ventricle  demonstrates global hypokinesis. Left ventricular diastolic parameters are  consistent with Grade I diastolic  dysfunction (impaired relaxation).  2. Right ventricular systolic function is normal. The right ventricular  size is normal.  3. The mitral valve is normal in structure and function. No evidence of  mitral valve regurgitation. No evidence of mitral stenosis.  4. The aortic valve is normal in structure and function. Aortic valve  regurgitation is mild. Mild aortic valve sclerosis is present, with no  evidence of aortic valve stenosis.  5. The inferior vena cava is normal in size with greater than 50%  respiratory variability, suggesting right atrial pressure of 3 mmHg.   Comparison(s): No prior Echocardiogram.   Patient Profile     74 y.o. male with PMH of chronic systolic CHF, PAF, descending thoracic aortic aneurysm, COPD, lung CA, GERD and HLD presented with worsening dyspnea for the past year  Assessment & Plan    1. Elevated troponin  - Myoview in 09/2017 at [facility] showed no ischemia  - hs troponin 112 --> 698 --> 763  - suspect demand ischemia in the setting of PNA, sepsis and COPD.   - EF down to 40-45%, maybe related to the stress from  sepsis. He would not be a good candidate for further cardiac workup given PNA, metastatic cancer, sepsis and hospice status   - continue coreg   2. PNA with hypoxia: currently hospice   3. Sepsis: 1 of 2 blood culture came back with gram positive cocci  4. PAF: maintaining NSR. Continue coreg and eliquis. Eliquis dose decreased down to 2.5mg  given weight and renal function  5. Chronic diastolic CHF  6. Lung CA: mestatic. He is hospice, however he is unaware of the diagnosis. Family plans to travel back to  [place] where the rest of family is and tell him there. Trip was cancelled due to travel resitriction. Dr. Rayann Heman discussed with Grandson regarding his right to know.   7. COPD: O2 in the mid 80s on nasal cannula  8. HLD  9. Descending thoracic aortic aneurysm: CT 02/2019 at [facility] showed 4.8 cm aneurysm  10. Acute on chronic renal insufficiency: with his small size and worsening renal function, consider reduce lasix to 20mg  daily.       For questions or updates, please contact [place] Please consult [email or web address] for contact info under        Signed, Almyra Deforest, [place]  05/06/2019, 7:56 AM

## 2019-05-06 NOTE — Progress Notes (Signed)
PHARMACY - PHYSICIAN COMMUNICATION CRITICAL VALUE ALERT - BLOOD CULTURE IDENTIFICATION (BCID)  Ross Roberts is an 74 y.o. male who presented to [facility] on 05/04/2019 with a chief complaint of hypoxia  2/14 Blood>>GPC in clusters, no BCID   Assessment:  WBC 11, afebrile  Name of physician (or Provider) Contacted: X Blount ([facility])  Current antibiotics: Vancomycin, Levaquin  Changes to prescribed antibiotics recommended:  None  No results found for this or any previous visit.  Ross Roberts 05/06/2019  4:52 AM

## 2019-05-06 NOTE — Progress Notes (Signed)
[facility] is a current pt with our hospice services.  Nursing with hospice ordered nitro patch to help with CP however, the grandson has never picked these up despite our nurse educating them on the use and need for medication.  He has oxygen, hospital bed, bedside commode and nebulizer in home for use. WE have him under the diagnosis of lung cancer with metastatic disease to the brain.   Please call or let us know if we can help with his care in anyway.   Plan will be to return home with family and grandson once felt stable.   Webb Silversmith RN [phone]

## 2019-05-06 NOTE — Progress Notes (Signed)
Initial Nutrition Assessment  DOCUMENTATION CODES:   Severe malnutrition in context of chronic illness  INTERVENTION:   Ensure Enlive po TID, each supplement provides 350 kcal and 20 grams of protein  MVI with minerals daily  Magic cup TID with meals, each supplement provides 290 kcal and 9 grams of protein  Educated on importance of adequate protein/calorie intake   NUTRITION DIAGNOSIS:   Severe Malnutrition related to chronic illness(lung cancer) as evidenced by energy intake < or equal to 75% for > or equal to 1 month, moderate fat depletion, severe fat depletion, moderate muscle depletion, severe muscle depletion, percent weight loss.   GOAL:   Patient will meet greater than or equal to 90% of their needs   MONITOR:   PO intake, Supplement acceptance, Weight trends, Labs  REASON FOR ASSESSMENT:   Malnutrition Screening Tool    ASSESSMENT:   Pt on hospice with a PMH significant for lung cancer, GERD, HTN, CKD stage III, moderate mitral regurgitation, CHF, thoracic aortic aneurysm, and COPD admitted for acute on chronic hypoxic respiratory failure likely 2/2 pneumonia as well as chest pain with elevated troponin.  Pt speaks limited English; grandson in room at time of visit to aid in translation. Discussed pt's appetite with grandson who reports pt's family has been trying to provide low sodium meals for the pt but has found that this decreases his intake. Discussed importance of adequate protein/calorie intake. Pt has been trying to drink Ensure but has not been drinking them consistently. Pt states he will try drinking 1-2 each day.   Only available wt hx in chart is from 03/11/19 [facility] Everywhere), pt weighed 96 lbs. Per pt's grandson, pt weighed 110 lbs in November. Pt currently 94 lbs, indicating a 14.5% wt loss x3 months, which is clinically significant for time frame.  PO intake: 75-100% x5 recorded meals (86% average intake)  Medications reviewed and include:  Pepcid, Lasix, remeron, IV abx  Labs reviewed.  UOP: 1,248ml x24 hours I/O: -30ml since admit   NUTRITION - FOCUSED PHYSICAL EXAM:    Most Recent Value  Orbital Region  Mild depletion  Upper Arm Region  Severe depletion  Thoracic and Lumbar Region  Severe depletion  Buccal Region  Moderate depletion  Temple Region  Moderate depletion  Clavicle Bone Region  Severe depletion  Clavicle and Acromion Bone Region  Severe depletion  Scapular Bone Region  Severe depletion  Dorsal Hand  Moderate depletion  Patellar Region  Severe depletion  Anterior Thigh Region  Severe depletion  Posterior Calf Region  Severe depletion  Edema (RD Assessment)  None  Hair  Reviewed  Eyes  Reviewed  Mouth  Reviewed  Skin  Reviewed  Nails  Reviewed       Diet Order:   Diet Order            Diet Heart Room service appropriate? Yes; Fluid consistency: Thin  Diet effective now              EDUCATION NEEDS:      Skin:  Skin Assessment: Reviewed RN Assessment  Last BM:  05/06/19  Height:   Ht Readings from Last 1 Encounters:  No data found for Ht    Weight:   Wt Readings from Last 1 Encounters:  05/06/19 42.6 kg    BMI:  There is no height or weight on file to calculate BMI.  Estimated Nutritional Needs:   Kcal:  1400-1600  Protein:  85-100 grams  Fluid:  >1.4L/d  Larkin Ina, MS, RD, LDN RD pager number and weekend/on-call pager number located in [place].

## 2019-05-06 NOTE — Progress Notes (Signed)
Spoke with patient's family via video phone and may them aware of patient's incidents tonight.  Family spoke to patient to keep O2 on and to not get out of bed w/o assistance.

## 2019-05-06 NOTE — Progress Notes (Signed)
Patient can be impulsive, took IV, leads, and O2 off.  He was out of bed and heading out the door.  Patient had been asleep and following directions last night. Safety measures will be turn on.

## 2019-05-06 NOTE — Progress Notes (Signed)
Pharmacy Antibiotic Note  Demon Volante is a 74 y.o. male admitted on 05/04/2019 with pneumonia.  Pharmacy has been consulted for Vancomycin/Levaquin dosing.   Plans to switch to cefepime (he tolerated ceftriazone at [facility] in 02/2019) -WBC= 15.4, afeb, SCr= 1.74, CrCl ~ 35 -blood cultures with - GPC in clusters 1/2  Plan: -Continue vancomycin (MRSA PCR pening -Cefepime 2gm IV q24hr -Will follow renal function, cultures and clinical progress     Temp (24hrs), Avg:98 F (36.7 C), Min:97.4 F (36.3 C), Max:98.3 F (36.8 C)  Recent Labs  Lab 05/04/19 2335 05/05/19 0508 05/06/19 0329  WBC 13.4* 11.0* 15.4*  CREATININE 1.81* 1.81* 1.74*    CrCl cannot be calculated (Unknown ideal weight.).    Allergies  Allergen Reactions  . Hydralazine Hcl Rash  . Isosorbide Rash  . Penicillins Rash    Tolerated ceftriaxone in 2020 at [facility]  Did it involve swelling of the face/tongue/throat, SOB, or low BP? Unknown Did it involve sudden or severe rash/hives, skin peeling, or any reaction on the inside of your mouth or nose? Yes Did you need to seek medical attention at a hospital or doctor's office? Yes When did it last happen?2015 If all above answers are "NO", may proceed with cephalosporin use.     Hildred Laser, PharmD Clinical Pharmacist **Pharmacist phone directory can now be found on [email or web address] ([email or web address]).  Listed under [place].

## 2019-05-06 NOTE — Progress Notes (Signed)
PROGRESS NOTE    Ross Roberts  ZOX:[id number] DOB: 07-Dec-1945 DOA: 05/04/2019 PCP: Teodoro Kil, PA-C  Brief Narrative:  HPI per Dr. Maudie Mercury on 05/05/2019  Ross Roberts  is a 74 y.o. male,  w Jerrye Bushy, Hypertension, CKD stage 3, Pafib, Moderate MR,  CHF (EF 40%), Thoracic AA, Copd,on home o2 2L [place] , lung cancer (per son), h/o pneumonia, presents with hypoxia. Pt's son states that pox 88%.  Pt notes slight chronic cough (nonproductive), chronic sob.   pt notes chronic intermittent chest pain all across the chest since at least Dec 2020.  Pt denies fever, chills, palp, n/v, abd pain, diarrhea, brbpr, black stool, dysuria, hematuria.   Pt was apparently given 2gm of magnesium, solumedrol 125mg  iv x1, and 0.3mg  of epi and a duoneb x1 en route    In ED,  T 96.8, P 95, R 27, Bp 160/80,  Pox 98% on NRB,   CXR  IMPRESSION: Widespread pneumonia favored, most severe in the right lower lobe. This could be bacterial or viral. Some component pulmonary edema/fluid overload could be present.  Na 140, K 3.7, Bun 27, Creatinine 1.81 Ast 33, Alt 14 Wbc 13.4, Hgb 12.9, Plt 347 BNP 69.5 Trop 112-> 698  Magnesium 3.5  ekg nsr at 70, nl axis, poor R progression , T inversion in v1,2  Pt will be admitted for acute hypoxic respiratory failure secondary to pneumonia, and elevated troponin likely secondary to hypoxia. And mild ARF  **Interim History Cardiology evaluated feel that he is down to candidate for further invasive work-up.  Continues to complain of shortness of breath and felt he had significant chest pressure.  He is coughing but not able to bring up productive sputum.  IV antibiotics have been changed from IV Levaquin to IV cefepime will be check an MRSA PCR.  Of note SARS coronavirus 2 is negative  Assessment & Plan:   Principal Problem:   Elevated troponin Active Problems:   Chest pain   Acute respiratory failure with hypoxia (HCC)   Pneumonia   Renal insufficiency  Shortness of breath   Protein-calorie malnutrition, severe   Acute respiratory failure with hypoxia secondary to Hcap the setting of his metastatic lung cancer and possible concomitant CHF exacerbation -Check blood culture x2 -SARS-CoV-2 is negative via the PCR and his influenza panel is negative as well -SpO2: 99 % O2 Flow Rate (L/min): 4 L/min -Urine strep antigen and Urine legionella antigen to be ordered and still pending -Ins IV Levaquin to IV cefepime and will continue IV vancomycin X-check MRSA PCR -Continue with breathing treatments as below and has albuterol as needed and I have scheduled Xopenex and Atrovent  -Continue with flutter valve, incentive spirometry as well as guaifenesin -Continue supplemental oxygen via nasal cannula and wean O2 as tolerated -Continuous pulse oximetry and maintain O2 saturations greater than 90%  Chest pain and elevated troponin, likely secondary to malignancy ? -Pt 's Yolanda Bonine states had had for months.  -Troponin I was Elevated and trended up to 763 -Cardiology consulted and they have added ranolazine as well as nitroglycerin and will continue his other medications including carvedilol --ECHO report as below -IV in 2019 which showed no ischemia -Patient continues to have some chest pressure and states that something will still like he was on his chest.  AKI on CKD Stage 3 -Stopped Hydration with NS even his history of CHF -Lasix was reduced down to 20 mg p.o. daily -Patient's BUN/creatinine went from 28/1.18 is now 39/1.74 -Avoid nephrotoxic medications,  contrast dyes as well as hypotension and renally adjust medications -Repeat CMP in the a.m.  Paroxysmal Atrial Fibrillation  -C/w Eliquis pharmacy to dose at 2.5 mg po BID  -Cont Carvedilol 25mg  po bid -C/w Telemetry Montioring and currently is maintaining sinus rhythm  Acute on Chronic Combined Systolic and DiastolicCHF (EF 75%) -Contined Lasix 40mg  po qday this was changed to 20 mg po  Daily by Cardiology -Had Dyspnea after IV Abx and will order stat CXR and Give Lasix x1 40 mg and have notified night team about response to intervention -Carvedilol as above at 25 mg po BID -Strict I's and O's and Daily Weights -Cardiology decreased his Lasix to 20 mg p.o. daily as he appears euvolemic to dry this morning however later in the afternoon he became more dyspneic and had some crackles so was given a dose of IV Lasix -Repeat ECHO showed EF of 40 to 45% with grade 1 diastolic dysfunction -Continue to Monitor for S/Sx of Volume Overload  -Cardiology he is not a candidate for further invasive work-up given his metastatic lung cancer and renal dysfunction  GERD -C/w Protonix 40mg  po qday and with Pepcid 20mg  po qhs -Has Sucralfate   Gout -Decrease Allopurinol due to renal dysfunction 100 mg p.o. daily  Copd -Continue albuterol 2.5 mg nebs every 4 as needed for wheezing or shortness of breath along with -I have added scheduled Xopenex and Atrovent -Continue with montelukast 10 mg p.o. nightly Plan we will give him a flutter valve, incentive spirometer and guaifenesin -Continue supplemental oxygen via nasal cannula  Severe protein calorie malnutrition -Nutritionist consulted -Continue with Ensure Enlive p.o. 3 times daily as well as multivitamin with minerals daily as well as Magic cup 3 times daily with meals -encourage p.o. intake  Metastatic lung cancer with mets to the brain -Patient apparently does not know about his diagnosis and when I brought this up with him he did not know antibiotic and his grandson told me not to tell him as they are going to tell him in the [place] when they travel back to the [place] with the rest of the family -Currently on hospice for this and hospice nurses following -Encouraged the family to tell the patient about his diagnosis -Continue with his morphine 7.5 mg p.o. 3 times daily as needed for severe pain  Hyperlipidemia  -Patient's total cholesterol/HDL ratio was 4.5, cholesterol level is 185, HDL was 41, LDL is 128, triglycerides were 78, and VLDL 16 -Defer to cardiology to start statin  Leukocytosis -In the setting of steroid demargination as he received a Medrol prior to coming to the hospital -Patient's WBC went from 11.0 and is now 15.4 next-antibiotics have changed as above and have stopped Levaquin and added cefepime -Continue to monitor temperature curve and WBC count and for signs and symptoms of infection  Macrocytic Anemia -Patient's hemoglobin/hematocrit went from 11.8/36.7 is now 10.9/33.5  -Check anemia panel in a.m. -Continue to monitor for signs and symptoms of bleeding; currently no overt bleeding noted -Repeat CBC in a.m.  GOC; DNR,poA  DVT prophylaxis: Anticoagulated with apixaban Code Status: DO NOT RESUSCITATE Family Communication: Discussed with family member who translated for me via the telephone Disposition Plan: Patient is from home and is currently on hospice but does not know his diagnosis of metastatic lung cancer to the brain; further disposition is pending improvement of his respiratory status and further work-up per cardiology and will need PT OT evaluation as well as at home amatory screen prior to  discharge.   Consultants:   Cardiology   Procedures:  ECHOCARDIOGRAM IMPRESSIONS    1. Left ventricular ejection fraction, by estimation, is 40 to 45%. The  left ventricle has mildly decreased function. The left ventricle  demonstrates global hypokinesis. Left ventricular diastolic parameters are  consistent with Grade I diastolic  dysfunction (impaired relaxation).  2. Right ventricular systolic function is normal. The right ventricular  size is normal.  3. The mitral valve is normal in structure and function. No evidence of  mitral valve regurgitation. No evidence of mitral stenosis.  4. The aortic valve is normal in structure and function. Aortic valve   regurgitation is mild. Mild aortic valve sclerosis is present, with no  evidence of aortic valve stenosis.  5. The inferior vena cava is normal in size with greater than 50%  respiratory variability, suggesting right atrial pressure of 3 mmHg.   Comparison(s): No prior Echocardiogram.   FINDINGS  Left Ventricle: Left ventricular ejection fraction, by estimation, is 40  to 45%. The left ventricle has mildly decreased function. The left  ventricle demonstrates global hypokinesis. The left ventricular internal  cavity size was normal in size. There is  no left ventricular hypertrophy. Left ventricular diastolic parameters  are consistent with Grade I diastolic dysfunction (impaired relaxation).  Normal left ventricular filling pressure.   Right Ventricle: The right ventricular size is normal. No increase in  right ventricular wall thickness. Right ventricular systolic function is  normal.   Left Atrium: Left atrial size was normal in size.   Right Atrium: Right atrial size was normal in size.   Pericardium: There is no evidence of pericardial effusion.   Mitral Valve: The mitral valve is normal in structure and function. Normal  mobility of the mitral valve leaflets. No evidence of mitral valve  regurgitation. No evidence of mitral valve stenosis.   Tricuspid Valve: The tricuspid valve is normal in structure. Tricuspid  valve regurgitation is not demonstrated. No evidence of tricuspid  stenosis.   Aortic Valve: The aortic valve is normal in structure and function. Aortic  valve regurgitation is mild. Aortic regurgitation PHT measures 595 msec.  Mild aortic valve sclerosis is present, with no evidence of aortic valve  stenosis.   Pulmonic Valve: The pulmonic valve was not well visualized. Pulmonic valve  regurgitation is not visualized. No evidence of pulmonic stenosis.   Aorta: The aortic root is normal in size and structure.   Venous: The inferior vena cava is normal in  size with greater than 50%  respiratory variability, suggesting right atrial pressure of 3 mmHg.   IAS/Shunts: No atrial level shunt detected by color flow Doppler.     LEFT VENTRICLE  PLAX 2D  LVIDd:     4.35 cm Diastology  LVIDs:     3.04 cm LV e' lateral:  4.90 cm/s  LV PW:     1.05 cm LV E/e' lateral: 8.1  LV IVS:    1.08 cm LV e' medial:  4.57 cm/s  LVOT diam:   2.20 cm LV E/e' medial: 8.7  LV SV:     72.23 ml  LVOT Area:   3.80 cm     RIGHT VENTRICLE  RV Basal diam: 1.85 cm  RV S prime:   11.30 cm/s  TAPSE (M-mode): 2.0 cm   LEFT ATRIUM       RIGHT ATRIUM  LA diam:    2.70 cm RA Area:   11.00 cm  LA Vol (A2C):  26.6 ml RA Volume:  23.20 ml  LA Vol (A4C):  27.6 ml  LA Biplane Vol: 27.4 ml  AORTIC VALVE  LVOT Vmax:  90.10 cm/s  LVOT Vmean: 58.900 cm/s  LVOT VTI:  0.190 m  AI PHT:   595 msec    AORTA  Ao Root diam: 3.30 cm   MITRAL VALVE  MV Area (PHT): 2.82 cm  SHUNTS  MV Decel Time: 269 msec  Systemic VTI: 0.19 m  MV E velocity: 39.65 cm/s Systemic Diam: 2.20 cm  MV A velocity: 88.70 cm/s  MV E/A ratio: 0.45   Antimicrobials:  Anti-infectives (From admission, onward)   Start     Dose/Rate Route Frequency Ordered Stop   05/06/19 1600  ceFEPIme (MAXIPIME) 2 g in sodium chloride 0.9 % 100 mL IVPB     2 g 200 mL/hr over 30 Minutes Intravenous Every 24 hours 05/06/19 1403     05/05/19 0700  levofloxacin (LEVAQUIN) IVPB 500 mg  Status:  Discontinued     500 mg 100 mL/hr over 60 Minutes Intravenous Every 48 hours 05/05/19 0538 05/06/19 1400   05/05/19 0600  vancomycin (VANCOREADY) IVPB 750 mg/150 mL     750 mg 150 mL/hr over 60 Minutes Intravenous Every 48 hours 05/05/19 0538       Subjective: Seen and examined at bedside and he states that he is still having some shortness of breath and feels significant moderate chest pressure.  No nausea or vomiting and states that he is drinking his  milk without issues.  No other concerns or complaints at this time.  He did speak to me in some English but most of the conversation was translated by his family member who is on the telephone via [place].  Objective: Vitals:   05/06/19 0400 05/06/19 0808 05/06/19 1304 05/06/19 1529  BP: (!) 118/45 (!) 144/55 (!) 151/51 128/75  Pulse: 80 75 78 74  Resp: 18 17 20 19   Temp: 98.2 F (36.8 C) 97.9 F (36.6 C) 98.3 F (36.8 C) 98.1 F (36.7 C)  TempSrc: Oral Oral Oral Oral  SpO2: 98% 100% 95% 99%  Weight: 42.6 kg       Intake/Output Summary (Last 24 hours) at 05/06/2019 1907 Last data filed at 05/06/2019 1305 Gross per 24 hour  Intake 1200 ml  Output 1000 ml  Net 200 ml   Filed Weights   05/06/19 0400  Weight: 42.6 kg   Examination: Physical Exam:  Constitutional: Thin Filipino male in, NAD and appears slightly anxious and does appear a little uncomfortable Eyes: PERRL, lids and conjunctivae normal, sclerae anicteric  ENMT: External Ears, Nose appear normal. Grossly normal hearing. Neck: Appears normal, supple, no cervical masses, normal ROM, no appreciable thyromegaly; no jVD Respiratory: Diminished to auscultation bilaterally with coarse breath sounds; wearing 4 L supplemental oxygen via nasal cannula Cardiovascular: RRR, no murmurs / rubs / gallops. S1 and S2 auscultated. No extremity edema. Abdomen: Soft, non-tender, non-distended.  Bowel sounds positive x4.  GU: Deferred. Musculoskeletal: No clubbing / cyanosis of digits/nails. No joint deformity upper and lower extremities. Skin: No rashes, lesions, ulcers on limited skin evaluation. No induration; Warm and dry.  Neurologic: CN 2-12 grossly intact with no focal deficits. Romberg sign cerebellar reflexes not assessed.  Psychiatric: Normal judgment and insight. Alert and oriented x 3.  Mildly anxious mood and appropriate affect.   Data Reviewed: I have personally reviewed following labs and imaging studies  CBC: Recent Labs   Lab 05/04/19 2335 05/05/19 0508 05/06/19 0329  WBC 13.4*  11.0* 15.4*  NEUTROABS 5.7  --  13.9*  HGB 12.9* 11.8* 10.9*  HCT 39.8 36.7* 33.5*  MCV 103.1* 102.5* 101.5*  PLT 347 271 211   Basic Metabolic Panel: Recent Labs  Lab 05/04/19 2335 05/05/19 0508 05/06/19 0329  NA 140 139 143  K 3.7 3.9 3.8  CL 99 103 105  CO2 27 23 24   GLUCOSE 170* 140* 146*  BUN 27* 28* 39*  CREATININE 1.81* 1.81* 1.74*  CALCIUM 9.0 8.6* 9.0  MG 3.5*  --  2.3  PHOS  --   --  3.3   GFR: CrCl cannot be calculated (Unknown ideal weight.). Liver Function Tests: Recent Labs  Lab 05/04/19 2335 05/05/19 0508 05/06/19 0329  AST 33 24 19  ALT 14 13 13   ALKPHOS 70 55 55  BILITOT 0.9 0.7 0.6  PROT 6.5 6.0* 5.4*  ALBUMIN 3.3* 2.9* 2.7*   No results for input(s): LIPASE, AMYLASE in the last 168 hours. No results for input(s): AMMONIA in the last 168 hours. Coagulation Profile: No results for input(s): INR, PROTIME in the last 168 hours. Cardiac Enzymes: Recent Labs  Lab 05/05/19 0147  CKTOTAL 50  CKMB 2.7   BNP (last 3 results) No results for input(s): PROBNP in the last 8760 hours. HbA1C: Recent Labs    05/05/19 1152  HGBA1C 6.2*   CBG: No results for input(s): GLUCAP in the last 168 hours. Lipid Profile: Recent Labs    05/05/19 0508  CHOL 185  HDL 41  LDLCALC 128*  TRIG 78  CHOLHDL 4.5   Thyroid Function Tests: No results for input(s): TSH, T4TOTAL, FREET4, T3FREE, THYROIDAB in the last 72 hours. Anemia Panel: No results for input(s): VITAMINB12, FOLATE, FERRITIN, TIBC, IRON, RETICCTPCT in the last 72 hours. Sepsis Labs: No results for input(s): PROCALCITON, LATICACIDVEN in the last 168 hours.  Recent Results (from the past 240 hour(s))  Respiratory Panel by RT PCR (Flu A&B, Covid) - Nasopharyngeal Swab     Status: None   Collection Time: 05/05/19  2:58 AM   Specimen: Nasopharyngeal Swab  Result Value Ref Range Status   SARS Coronavirus 2 by RT PCR NEGATIVE NEGATIVE  Final    Comment: (NOTE) SARS-CoV-2 target nucleic acids are NOT DETECTED. The SARS-CoV-2 RNA is generally detectable in upper respiratoy specimens during the acute phase of infection. The lowest concentration of SARS-CoV-2 viral copies this assay can detect is 131 copies/mL. A negative result does not preclude SARS-Cov-2 infection and should not be used as the sole basis for treatment or other patient management decisions. A negative result may occur with  improper specimen collection/handling, submission of specimen other than nasopharyngeal swab, presence of viral mutation(s) within the areas targeted by this assay, and inadequate number of viral copies (<131 copies/mL). A negative result must be combined with clinical observations, patient history, and epidemiological information. The expected result is Negative. Fact Sheet for Patients:  PinkCheek.be Fact Sheet for Healthcare Providers:  GravelBags.it This test is not yet ap proved or cleared by the [place] FDA and  has been authorized for detection and/or diagnosis of SARS-CoV-2 by FDA under an Emergency Use Authorization (EUA). This EUA will remain  in effect (meaning this test can be used) for the duration of the COVID-19 declaration under Section 564(b)(1) of the Act, 21 U.S.C. section 360bbb-3(b)(1), unless the authorization is terminated or revoked sooner.    Influenza A by PCR NEGATIVE NEGATIVE Final   Influenza B by PCR NEGATIVE NEGATIVE Final  Comment: (NOTE) The Xpert Xpress SARS-CoV-2/FLU/RSV assay is intended as an aid in  the diagnosis of influenza from Nasopharyngeal swab specimens and  should not be used as a sole basis for treatment. Nasal washings and  aspirates are unacceptable for Xpert Xpress SARS-CoV-2/FLU/RSV  testing. Fact Sheet for Patients: PinkCheek.be Fact Sheet for Healthcare Providers:  GravelBags.it This test is not yet approved or cleared by the [place] FDA and  has been authorized for detection and/or diagnosis of SARS-CoV-2 by  FDA under an Emergency Use Authorization (EUA). This EUA will remain  in effect (meaning this test can be used) for the duration of the  Covid-19 declaration under Section 564(b)(1) of the Act, 21  U.S.C. section 360bbb-3(b)(1), unless the authorization is  terminated or revoked. Performed at [facility], [place] [address], [place], [place] [address]   Culture, blood (routine x 2)     Status: None (Preliminary result)   Collection Time: 05/05/19  4:35 AM   Specimen: BLOOD RIGHT WRIST  Result Value Ref Range Status   Specimen Description BLOOD RIGHT WRIST  Final   Special Requests   Final    BOTTLES DRAWN AEROBIC ONLY Blood Culture results may not be optimal due to an inadequate volume of blood received in culture bottles   Culture   Final    NO GROWTH 1 DAY Performed at [facility], [place] [address], [place], [place] [address]    Report Status PENDING  Incomplete  Culture, blood (routine x 2)     Status: None (Preliminary result)   Collection Time: 05/05/19  5:08 AM   Specimen: BLOOD  Result Value Ref Range Status   Specimen Description BLOOD LEFT HAND  Final   Special Requests   Final    BOTTLES DRAWN AEROBIC AND ANAEROBIC Blood Culture results may not be optimal due to an inadequate volume of blood received in culture bottles   Culture  Setup Time   Final    GRAM POSITIVE COCCI IN CLUSTERS ANAEROBIC BOTTLE ONLY CRITICAL RESULT CALLED TO, READ BACK BY AND VERIFIED WITH: Serita Grammes [id number] 05/06/2019 Mena Goes Performed at [facility], [place] [address] [place], [place] [address]    Culture GRAM POSITIVE COCCI  Final   Report Status PENDING  Incomplete  MRSA PCR Screening     Status: None   Collection Time: 05/06/19 12:00 PM   Specimen: Nasal Mucosa; Nasopharyngeal  Result Value  Ref Range Status   MRSA by PCR NEGATIVE NEGATIVE Final    Comment:        The GeneXpert MRSA Assay (FDA approved for NASAL specimens only), is one component of a comprehensive MRSA colonization surveillance program. It is not intended to diagnose MRSA infection nor to guide or monitor treatment for MRSA infections. Performed at [facility], [place] [address], [place], [place] [address]      RN Pressure Injury Documentation:     There is no height or weight on file to calculate BMI.  Malnutrition Type:  Nutrition Problem: Severe Malnutrition Etiology: chronic illness(lung cancer)   Malnutrition Characteristics:  Signs/Symptoms: energy intake < or equal to 75% for > or equal to 1 month, moderate fat depletion, severe fat depletion, moderate muscle depletion, severe muscle depletion, percent weight loss Percent weight loss: 14.5 %   Nutrition Interventions:  Interventions: Ensure Enlive (each supplement provides 350kcal and 20 grams of protein), MVI, Education    Radiology Studies: DG CHEST PORT 1 VIEW  Result  Date: 05/06/2019 CLINICAL DATA:  Shortness of breath. Additional provided: History of CHF, atrial fibrillation EXAM: PORTABLE CHEST 1 VIEW COMPARISON:  Chest radiograph 05/05/2019 FINDINGS: Unchanged mild cardiomegaly. Aortic atherosclerosis. Redemonstrated patchy bilateral airspace opacities most pronounced within the right middle and lower lobes. Small right pleural effusion. No evidence of pneumothorax. Prominent thoracolumbar scoliosis. No acute bony abnormality. Overlying cardiac monitoring leads. IMPRESSION: No significant interval change as compared to chest radiograph 05/05/2019. Redemonstrated patchy bilateral airspace opacities most prominent within the right middle and lower lobes likely reflecting multifocal pneumonia. As before, a component of pulmonary edema could be present. Small right pleural effusion. Mild cardiomegaly with aortic atherosclerosis.  Electronically Signed   By: Kellie Simmering DO   On: 05/06/2019 08:08   DG Chest Port 1 View  Result Date: 05/05/2019 CLINICAL DATA:  Shortness of breath EXAM: PORTABLE CHEST 1 VIEW COMPARISON:  None. FINDINGS: Artifact overlies the chest. Chronic scoliosis of the spine. Mild cardiomegaly. Aortic atherosclerosis and tortuosity. Abnormal bilateral patchy pulmonary density, most pronounced in the right lower lobe. Findings most consistent with widespread bronchopneumonia. This could be bacterial or viral. There is also some possibility the findings could reflect acute heart failure. IMPRESSION: Widespread pneumonia favored, most severe in the right lower lobe. This could be bacterial or viral. Some component pulmonary edema/fluid overload could be present. Electronically Signed   By: Nelson Chimes M.D.   On: 05/05/2019 00:39   ECHOCARDIOGRAM COMPLETE  Result Date: 05/05/2019    ECHOCARDIOGRAM REPORT   Patient Name:   Ross Roberts Date of Exam: 05/05/2019 Medical Rec #:  [id number]        Height: Accession #:    [id number]       Weight: Date of Birth:  07-30-1945       BSA: Patient Age:    73 years         BP:           127/85 mmHg Patient Gender: M                HR:           76 bpm. Exam Location:  Inpatient Procedure: 2D Echo, Cardiac Doppler and Color Doppler Indications:    Chest pain  History:        Patient has no prior history of Echocardiogram examinations.                 CHF, COPD, Arrythmias:Atrial Fibrillation; Risk                 Factors:Hypertension. Lung cancer, pneumoina.  Sonographer:    Dustin Flock Referring Phys: [place]  1. Left ventricular ejection fraction, by estimation, is 40 to 45%. The left ventricle has mildly decreased function. The left ventricle demonstrates global hypokinesis. Left ventricular diastolic parameters are consistent with Grade I diastolic dysfunction (impaired relaxation).  2. Right ventricular systolic function is normal. The right  ventricular size is normal.  3. The mitral valve is normal in structure and function. No evidence of mitral valve regurgitation. No evidence of mitral stenosis.  4. The aortic valve is normal in structure and function. Aortic valve regurgitation is mild. Mild aortic valve sclerosis is present, with no evidence of aortic valve stenosis.  5. The inferior vena cava is normal in size with greater than 50% respiratory variability, suggesting right atrial pressure of 3 mmHg. Comparison(s): No prior Echocardiogram. FINDINGS  Left Ventricle: Left ventricular ejection fraction, by estimation, is 40 to 45%. The  left ventricle has mildly decreased function. The left ventricle demonstrates global hypokinesis. The left ventricular internal cavity size was normal in size. There is  no left ventricular hypertrophy. Left ventricular diastolic parameters are consistent with Grade I diastolic dysfunction (impaired relaxation). Normal left ventricular filling pressure. Right Ventricle: The right ventricular size is normal. No increase in right ventricular wall thickness. Right ventricular systolic function is normal. Left Atrium: Left atrial size was normal in size. Right Atrium: Right atrial size was normal in size. Pericardium: There is no evidence of pericardial effusion. Mitral Valve: The mitral valve is normal in structure and function. Normal mobility of the mitral valve leaflets. No evidence of mitral valve regurgitation. No evidence of mitral valve stenosis. Tricuspid Valve: The tricuspid valve is normal in structure. Tricuspid valve regurgitation is not demonstrated. No evidence of tricuspid stenosis. Aortic Valve: The aortic valve is normal in structure and function. Aortic valve regurgitation is mild. Aortic regurgitation PHT measures 595 msec. Mild aortic valve sclerosis is present, with no evidence of aortic valve stenosis. Pulmonic Valve: The pulmonic valve was not well visualized. Pulmonic valve regurgitation is not  visualized. No evidence of pulmonic stenosis. Aorta: The aortic root is normal in size and structure. Venous: The inferior vena cava is normal in size with greater than 50% respiratory variability, suggesting right atrial pressure of 3 mmHg. IAS/Shunts: No atrial level shunt detected by color flow Doppler.  LEFT VENTRICLE PLAX 2D LVIDd:         4.35 cm  Diastology LVIDs:         3.04 cm  LV e' lateral:   4.90 cm/s LV PW:         1.05 cm  LV E/e' lateral: 8.1 LV IVS:        1.08 cm  LV e' medial:    4.57 cm/s LVOT diam:     2.20 cm  LV E/e' medial:  8.7 LV SV:         72.23 ml LVOT Area:     3.80 cm  RIGHT VENTRICLE RV Basal diam:  1.85 cm RV S prime:     11.30 cm/s TAPSE (M-mode): 2.0 cm LEFT ATRIUM             RIGHT ATRIUM LA diam:        2.70 cm RA Area:     11.00 cm LA Vol (A2C):   26.6 ml RA Volume:   23.20 ml LA Vol (A4C):   27.6 ml LA Biplane Vol: 27.4 ml  AORTIC VALVE LVOT Vmax:   90.10 cm/s LVOT Vmean:  58.900 cm/s LVOT VTI:    0.190 m AI PHT:      595 msec  AORTA Ao Root diam: 3.30 cm MITRAL VALVE MV Area (PHT): 2.82 cm    SHUNTS MV Decel Time: 269 msec    Systemic VTI:  0.19 m MV E velocity: 39.65 cm/s  Systemic Diam: 2.20 cm MV A velocity: 88.70 cm/s MV E/A ratio:  0.45 Mihai Croitoru MD Electronically signed by Sanda Klein MD Signature Date/Time: 05/05/2019/2:02:08 PM    Final    Scheduled Meds: . furosemide      . allopurinol  100 mg Oral Daily  . apixaban  2.5 mg Oral BID  . carvedilol  25 mg Oral BID WC  . famotidine  20 mg Oral QHS  . feeding supplement (ENSURE ENLIVE)  237 mL Oral TID BM  . furosemide  40 mg Intravenous Once  . [START ON 05/07/2019] furosemide  20 mg Oral Daily  . mirtazapine  7.5 mg Oral QHS  . montelukast  10 mg Oral QHS  . multivitamin with minerals  1 tablet Oral Daily  . pantoprazole  40 mg Oral Daily  . ranolazine  500 mg Oral BID  . sucralfate  1 g Oral TID WC   Continuous Infusions: . ceFEPime (MAXIPIME) IV 2 g (05/06/19 1816)  . vancomycin Stopped  (05/05/19 0803)    LOS: 1 day   Kerney Elbe, DO [facility] PAGER is on [place]  If 7PM-7AM, please contact night-coverage [email or web address]

## 2019-05-07 ENCOUNTER — Inpatient Hospital Stay (HOSPITAL_COMMUNITY)

## 2019-05-07 LAB — CBC WITH DIFFERENTIAL/PLATELET
Abs Immature Granulocytes: 0.06 10*3/uL (ref 0.00–0.07)
Basophils Absolute: 0.1 10*3/uL (ref 0.0–0.1)
Basophils Relative: 0 %
Eosinophils Absolute: 0.8 10*3/uL — ABNORMAL HIGH (ref 0.0–0.5)
Eosinophils Relative: 5 %
HCT: 34.9 % — ABNORMAL LOW (ref 39.0–52.0)
Hemoglobin: 11.6 g/dL — ABNORMAL LOW (ref 13.0–17.0)
Immature Granulocytes: 0 %
Lymphocytes Relative: 9 %
Lymphs Abs: 1.4 10*3/uL (ref 0.7–4.0)
MCH: 33.1 pg (ref 26.0–34.0)
MCHC: 33.2 g/dL (ref 30.0–36.0)
MCV: 99.7 fL (ref 80.0–100.0)
Monocytes Absolute: 1.2 10*3/uL — ABNORMAL HIGH (ref 0.1–1.0)
Monocytes Relative: 8 %
Neutro Abs: 12.2 10*3/uL — ABNORMAL HIGH (ref 1.7–7.7)
Neutrophils Relative %: 78 %
Platelets: 273 10*3/uL (ref 150–400)
RBC: 3.5 MIL/uL — ABNORMAL LOW (ref 4.22–5.81)
RDW: 14.6 % (ref 11.5–15.5)
WBC: 15.7 10*3/uL — ABNORMAL HIGH (ref 4.0–10.5)
nRBC: 0 % (ref 0.0–0.2)

## 2019-05-07 LAB — VITAMIN B12: Vitamin B-12: 321 pg/mL (ref 180–914)

## 2019-05-07 LAB — RETICULOCYTES
Immature Retic Fract: 19 % — ABNORMAL HIGH (ref 2.3–15.9)
RBC.: 3.49 MIL/uL — ABNORMAL LOW (ref 4.22–5.81)
Retic Count, Absolute: 59.3 10*3/uL (ref 19.0–186.0)
Retic Ct Pct: 1.7 % (ref 0.4–3.1)

## 2019-05-07 LAB — PHOSPHORUS: Phosphorus: 3.3 mg/dL (ref 2.5–4.6)

## 2019-05-07 LAB — COMPREHENSIVE METABOLIC PANEL
ALT: 32 U/L (ref 0–44)
AST: 34 U/L (ref 15–41)
Albumin: 3 g/dL — ABNORMAL LOW (ref 3.5–5.0)
Alkaline Phosphatase: 59 U/L (ref 38–126)
Anion gap: 10 (ref 5–15)
BUN: 48 mg/dL — ABNORMAL HIGH (ref 8–23)
CO2: 27 mmol/L (ref 22–32)
Calcium: 9.1 mg/dL (ref 8.9–10.3)
Chloride: 106 mmol/L (ref 98–111)
Creatinine, Ser: 1.79 mg/dL — ABNORMAL HIGH (ref 0.61–1.24)
GFR calc Af Amer: 42 mL/min — ABNORMAL LOW (ref >60)
GFR calc non Af Amer: 37 mL/min — ABNORMAL LOW (ref >60)
Glucose, Bld: 95 mg/dL (ref 70–99)
Potassium: 3.4 mmol/L — ABNORMAL LOW (ref 3.5–5.1)
Sodium: 143 mmol/L (ref 135–145)
Total Bilirubin: 0.8 mg/dL (ref 0.3–1.2)
Total Protein: 6 g/dL — ABNORMAL LOW (ref 6.5–8.1)

## 2019-05-07 LAB — IRON AND TIBC
Iron: 72 ug/dL (ref 45–182)
Saturation Ratios: 30 % (ref 17.9–39.5)
TIBC: 242 ug/dL — ABNORMAL LOW (ref 250–450)
UIBC: 170 ug/dL

## 2019-05-07 LAB — FERRITIN: Ferritin: 417 ng/mL — ABNORMAL HIGH (ref 24–336)

## 2019-05-07 LAB — FOLATE: Folate: 12 ng/mL (ref >5.9)

## 2019-05-07 LAB — MAGNESIUM: Magnesium: 2.1 mg/dL (ref 1.7–2.4)

## 2019-05-07 MED ORDER — POTASSIUM CHLORIDE CRYS ER 20 MEQ PO TBCR
40.0000 meq | EXTENDED_RELEASE_TABLET | Freq: Once | ORAL | Status: AC
  Administered 2019-05-07: 40 meq via ORAL
  Filled 2019-05-07: qty 2

## 2019-05-07 MED ORDER — LEVALBUTEROL HCL 0.63 MG/3ML IN NEBU
0.6300 mg | INHALATION_SOLUTION | Freq: Two times a day (BID) | RESPIRATORY_TRACT | Status: DC
  Administered 2019-05-07 – 2019-05-09 (×4): 0.63 mg via RESPIRATORY_TRACT
  Filled 2019-05-07 (×4): qty 3

## 2019-05-07 MED ORDER — IPRATROPIUM BROMIDE 0.02 % IN SOLN
0.5000 mg | Freq: Two times a day (BID) | RESPIRATORY_TRACT | Status: DC
  Administered 2019-05-07 – 2019-05-09 (×4): 0.5 mg via RESPIRATORY_TRACT
  Filled 2019-05-07 (×4): qty 2.5

## 2019-05-07 NOTE — Progress Notes (Addendum)
Progress Note  Patient Name: Ross Roberts Date of Encounter: 05/07/2019  Primary Cardiologist: No primary care provider on file.   Subjective   Patient currently denies chest pain or difficulty breathing.  He still continues on supplemental oxygen.  Inpatient Medications    Scheduled Meds: . allopurinol  100 mg Oral Daily  . apixaban  2.5 mg Oral BID  . carvedilol  25 mg Oral BID WC  . famotidine  20 mg Oral QHS  . feeding supplement (ENSURE ENLIVE)  237 mL Oral TID BM  . furosemide  20 mg Oral Daily  . guaiFENesin  1,200 mg Oral BID  . ipratropium  0.5 mg Nebulization BID  . levalbuterol  0.63 mg Nebulization BID  . mirtazapine  7.5 mg Oral QHS  . montelukast  10 mg Oral QHS  . multivitamin with minerals  1 tablet Oral Daily  . pantoprazole  40 mg Oral Daily  . potassium chloride  40 mEq Oral Once  . ranolazine  500 mg Oral BID  . sucralfate  1 g Oral TID WC   Continuous Infusions: . ceFEPime (MAXIPIME) IV 2 g (05/06/19 1816)  . vancomycin 750 mg (05/07/19 0604)   PRN Meds: albuterol, benzonatate, gabapentin, LORazepam, morphine, nitroGLYCERIN, ondansetron (ZOFRAN) IV   Vital Signs    Vitals:   05/06/19 2356 05/07/19 0443 05/07/19 0757 05/07/19 0817  BP:  (!) 156/57  (!) 148/80  Pulse:  75 75   Resp:  16 (!) 21   Temp: 97.6 F (36.4 C) 97.9 F (36.6 C)  98 F (36.7 C)  TempSrc: Oral Oral  Oral  SpO2:  94% 95%   Weight:  41.2 kg      Intake/Output Summary (Last 24 hours) at 05/07/2019 0938 Last data filed at 05/07/2019 0817 Gross per 24 hour  Intake 360 ml  Output 2000 ml  Net -1640 ml   Last 3 Weights 05/07/2019 05/06/2019  Weight (lbs) 90 lb 14.4 oz 94 lb  Weight (kg) 41.232 kg 42.638 kg      Telemetry    Sinus rhythm in the 70s- Personally Reviewed  ECG    No new tracings for review  Physical Exam   GEN:  Very thin male patient, no acute distress.   Neck:  Prominent neck veins Cardiac: RRR, no murmurs, rubs, or gallops.    Respiratory: Clear to auscultation bilaterally. GI: Soft, nontender, non-distended  MS: No edema; No deformity. Neuro:  Nonfocal  Psych: Normal affect   Labs    High Sensitivity Troponin:   Recent Labs  Lab 05/04/19 2335 05/05/19 0147 05/05/19 0508  TROPONINIHS 112* 698* 763*      Chemistry Recent Labs  Lab 05/05/19 0508 05/06/19 0329 05/07/19 0504  NA 139 143 143  K 3.9 3.8 3.4*  CL 103 105 106  CO2 23 24 27   GLUCOSE 140* 146* 95  BUN 28* 39* 48*  CREATININE 1.81* 1.74* 1.79*  CALCIUM 8.6* 9.0 9.1  PROT 6.0* 5.4* 6.0*  ALBUMIN 2.9* 2.7* 3.0*  AST 24 19 34  ALT 13 13 32  ALKPHOS 55 55 59  BILITOT 0.7 0.6 0.8  GFRNONAA 36* 38* 37*  GFRAA 42* 44* 42*  ANIONGAP 13 14 10      Hematology Recent Labs  Lab 05/05/19 0508 05/05/19 0508 05/06/19 0329 05/07/19 0504  WBC 11.0*  --  15.4* 15.7*  RBC 3.58*   < > 3.30* 3.50*  3.49*  HGB 11.8*  --  10.9* 11.6*  HCT 36.7*  --  33.5* 34.9*  MCV 102.5*  --  101.5* 99.7  MCH 33.0  --  33.0 33.1  MCHC 32.2  --  32.5 33.2  RDW 14.6  --  14.5 14.6  PLT 271  --  276 273   < > = values in this interval not displayed.    BNP Recent Labs  Lab 05/04/19 2335  BNP 69.5     DDimer No results for input(s): DDIMER in the last 168 hours.   Radiology    DG CHEST PORT 1 VIEW  Result Date: 05/07/2019 CLINICAL DATA:  Shortness of breath. EXAM: PORTABLE CHEST 1 VIEW COMPARISON:  05/06/2019. FINDINGS: Aneurysmal dilatation of the thoracic aorta cannot be excluded. Stable cardiomegaly. Persistent bilateral pulmonary infiltrates, most prominent in the right mid and lower lung. Slight interim improvement. Tiny bilateral pleural effusions cannot be excluded. No pneumothorax. Thoracic spine scoliosis and degenerative change. IMPRESSION: 1. Persistent bilateral pulmonary infiltrates, most prominent in the right mid and lower lung. Slight interim improvement. Tiny bilateral pleural effusion noted. 2.  Cardiomegaly.  No pulmonary venous  congestion. 3.  Aneurysmal dilatation of the thoracic aorta cannot be excluded. Electronically Signed   By: Marcello Moores  Register   On: 05/07/2019 08:57   DG CHEST PORT 1 VIEW  Result Date: 05/06/2019 CLINICAL DATA:  Shortness of breath. EXAM: PORTABLE CHEST 1 VIEW COMPARISON:  Single-view of the chest earlier today. FINDINGS: Extensive airspace disease in the right mid and lower lung zones is unchanged. The left lung remains clear. Heart size is upper normal. No pneumothorax. No acute bony abnormality. Marked scoliosis noted. IMPRESSION: No change in extensive airspace disease in the right mid and lower lung zones consistent with pneumonia. Electronically Signed   By: Inge Rise M.D.   On: 05/06/2019 19:27   DG CHEST PORT 1 VIEW  Result Date: 05/06/2019 CLINICAL DATA:  Shortness of breath. Additional provided: History of CHF, atrial fibrillation EXAM: PORTABLE CHEST 1 VIEW COMPARISON:  Chest radiograph 05/05/2019 FINDINGS: Unchanged mild cardiomegaly. Aortic atherosclerosis. Redemonstrated patchy bilateral airspace opacities most pronounced within the right middle and lower lobes. Small right pleural effusion. No evidence of pneumothorax. Prominent thoracolumbar scoliosis. No acute bony abnormality. Overlying cardiac monitoring leads. IMPRESSION: No significant interval change as compared to chest radiograph 05/05/2019. Redemonstrated patchy bilateral airspace opacities most prominent within the right middle and lower lobes likely reflecting multifocal pneumonia. As before, a component of pulmonary edema could be present. Small right pleural effusion. Mild cardiomegaly with aortic atherosclerosis. Electronically Signed   By: Kellie Simmering DO   On: 05/06/2019 08:08   ECHOCARDIOGRAM COMPLETE  Result Date: 05/05/2019    ECHOCARDIOGRAM REPORT   Patient Name:   Ross Roberts Date of Exam: 05/05/2019 Medical Rec #:  [id number]        Height: Accession #:    [id number]       Weight: Date of Birth:  February 24, 1946        BSA: Patient Age:    74 years         BP:           127/85 mmHg Patient Gender: M                HR:           76 bpm. Exam Location:  Inpatient Procedure: 2D Echo, Cardiac Doppler and Color Doppler Indications:    Chest pain  History:        Patient has no prior history of Echocardiogram examinations.  CHF, COPD, Arrythmias:Atrial Fibrillation; Risk                 Factors:Hypertension. Lung cancer, pneumoina.  Sonographer:    Dustin Flock Referring Phys: [place]  1. Left ventricular ejection fraction, by estimation, is 40 to 45%. The left ventricle has mildly decreased function. The left ventricle demonstrates global hypokinesis. Left ventricular diastolic parameters are consistent with Grade I diastolic dysfunction (impaired relaxation).  2. Right ventricular systolic function is normal. The right ventricular size is normal.  3. The mitral valve is normal in structure and function. No evidence of mitral valve regurgitation. No evidence of mitral stenosis.  4. The aortic valve is normal in structure and function. Aortic valve regurgitation is mild. Mild aortic valve sclerosis is present, with no evidence of aortic valve stenosis.  5. The inferior vena cava is normal in size with greater than 50% respiratory variability, suggesting right atrial pressure of 3 mmHg. Comparison(s): No prior Echocardiogram. FINDINGS  Left Ventricle: Left ventricular ejection fraction, by estimation, is 40 to 45%. The left ventricle has mildly decreased function. The left ventricle demonstrates global hypokinesis. The left ventricular internal cavity size was normal in size. There is  no left ventricular hypertrophy. Left ventricular diastolic parameters are consistent with Grade I diastolic dysfunction (impaired relaxation). Normal left ventricular filling pressure. Right Ventricle: The right ventricular size is normal. No increase in right ventricular wall thickness. Right ventricular  systolic function is normal. Left Atrium: Left atrial size was normal in size. Right Atrium: Right atrial size was normal in size. Pericardium: There is no evidence of pericardial effusion. Mitral Valve: The mitral valve is normal in structure and function. Normal mobility of the mitral valve leaflets. No evidence of mitral valve regurgitation. No evidence of mitral valve stenosis. Tricuspid Valve: The tricuspid valve is normal in structure. Tricuspid valve regurgitation is not demonstrated. No evidence of tricuspid stenosis. Aortic Valve: The aortic valve is normal in structure and function. Aortic valve regurgitation is mild. Aortic regurgitation PHT measures 595 msec. Mild aortic valve sclerosis is present, with no evidence of aortic valve stenosis. Pulmonic Valve: The pulmonic valve was not well visualized. Pulmonic valve regurgitation is not visualized. No evidence of pulmonic stenosis. Aorta: The aortic root is normal in size and structure. Venous: The inferior vena cava is normal in size with greater than 50% respiratory variability, suggesting right atrial pressure of 3 mmHg. IAS/Shunts: No atrial level shunt detected by color flow Doppler.  LEFT VENTRICLE PLAX 2D LVIDd:         4.35 cm  Diastology LVIDs:         3.04 cm  LV e' lateral:   4.90 cm/s LV PW:         1.05 cm  LV E/e' lateral: 8.1 LV IVS:        1.08 cm  LV e' medial:    4.57 cm/s LVOT diam:     2.20 cm  LV E/e' medial:  8.7 LV SV:         72.23 ml LVOT Area:     3.80 cm  RIGHT VENTRICLE RV Basal diam:  1.85 cm RV S prime:     11.30 cm/s TAPSE (M-mode): 2.0 cm LEFT ATRIUM             RIGHT ATRIUM LA diam:        2.70 cm RA Area:     11.00 cm LA Vol (A2C):   26.6 ml RA Volume:  23.20 ml LA Vol (A4C):   27.6 ml LA Biplane Vol: 27.4 ml  AORTIC VALVE LVOT Vmax:   90.10 cm/s LVOT Vmean:  58.900 cm/s LVOT VTI:    0.190 m AI PHT:      595 msec  AORTA Ao Root diam: 3.30 cm MITRAL VALVE MV Area (PHT): 2.82 cm    SHUNTS MV Decel Time: 269 msec     Systemic VTI:  0.19 m MV E velocity: 39.65 cm/s  Systemic Diam: 2.20 cm MV A velocity: 88.70 cm/s MV E/A ratio:  0.45 Mihai Croitoru MD Electronically signed by Sanda Klein MD Signature Date/Time: 05/05/2019/2:02:08 PM    Final     Cardiac Studies   Echo 05/05/2019 1. Left ventricular ejection fraction, by estimation, is 40 to 45%. The  left ventricle has mildly decreased function. The left ventricle  demonstrates global hypokinesis. Left ventricular diastolic parameters are  consistent with Grade I diastolic  dysfunction (impaired relaxation).  2. Right ventricular systolic function is normal. The right ventricular  size is normal.  3. The mitral valve is normal in structure and function. No evidence of  mitral valve regurgitation. No evidence of mitral stenosis.  4. The aortic valve is normal in structure and function. Aortic valve  regurgitation is mild. Mild aortic valve sclerosis is present, with no  evidence of aortic valve stenosis.  5. The inferior vena cava is normal in size with greater than 50%  respiratory variability, suggesting right atrial pressure of 3 mmHg.   Comparison(s): No prior Echocardiogram.    Patient Profile     74 y.o. male with PMH of chronic systolic CHF, PAF, descending thoracic aortic aneurysm, COPD, lung CA, GERD and HLD presented with worsening dyspnea for the past year  Assessment & Plan    Elevated troponin -Patient had a Myoview in 09/2017 at [place] that showed no ischemia. -HS troponins 112> 698> 763 -EF down to 40-45%, possibly related to the stress from sepsis.  Patient would not be a good candidate for invasive cardiac procedures given current pneumonia, metastatic cancer, sepsis, renal function and hospital status. -Patient continues on Coreg -Notes indicate that patient has had some intermittent chest pain.  Patient denies chest pain to me this morning.  Paroxysmal atrial fibrillation -Maintaining sinus rhythm.  Continue  beta-blocker and anticoagulation with Eliquis.  Patient is on reduced dose of Eliquis due to weight and renal function.  Chronic diastolic and now systolic CHF -Echo showed EF 40-45% with grade 1 diastolic dysfunction.  Patient not currently a candidate for further work-up of his decreased EF. -Patient does not appear significantly volume overloaded.  No current dyspnea, orthopnea or edema.  He does have prominent neck veins, possibly due to some mild increased volume versus his thin body habitus and cachectic state.  Pneumonia with hypoxia/Sepsis -Widespread pneumonia, most severe in the right lower lobe, with possibly some component of edema. -COVID-19 tested negative. -Management per primary team.  On IV antibiotics, supplemental oxygen and bronchodilator. Patient is currently on hospice.  Lung cancer, metastatic Patient is on hospice, however he is unaware of the diagnosis.  Family plans to travel back to the [place] where the rest of the family is and tell him there.  Trip was canceled due to travel restrictions.  Dr. Rayann Heman reportedly discussed the patient's right to know with the patient's grandson.  Acute on chronic kidney disease -Serum creatinine is hovering around 1.8.  For questions or updates, please contact [place] Please consult [email or web address] for contact  info under        Signed, Daune Perch, NP  05/07/2019, 9:38 AM

## 2019-05-07 NOTE — Plan of Care (Signed)
  Problem: Health Behavior/Discharge Planning: Goal: Ability to manage health-related needs will improve Outcome: Progressing   Problem: Clinical Measurements: Goal: Ability to maintain clinical measurements within normal limits will improve Outcome: Progressing   Problem: Activity: Goal: Risk for activity intolerance will decrease Outcome: Progressing   Problem: Nutrition: Goal: Adequate nutrition will be maintained Outcome: Progressing   Problem: Coping: Goal: Level of anxiety will decrease Outcome: Progressing   Problem: Pain Managment: Goal: General experience of comfort will improve Outcome: Progressing   Problem: Skin Integrity: Goal: Risk for impaired skin integrity will decrease Outcome: Progressing   Problem: Activity: Goal: Ability to tolerate increased activity will improve Outcome: Progressing   Problem: Respiratory: Goal: Ability to maintain a clear airway will improve Outcome: Progressing Goal: Levels of oxygenation will improve Outcome: Progressing Goal: Ability to maintain adequate ventilation will improve Outcome: Progressing

## 2019-05-07 NOTE — Progress Notes (Signed)
PROGRESS NOTE    Ross Roberts  BPZ:[id number] DOB: 06-Nov-1945 DOA: 05/04/2019 PCP: Teodoro Kil, PA-C  Brief Narrative:  HPI per Dr. Maudie Mercury on 05/05/2019  Ross Roberts  is a 74 y.o. male,  w Jerrye Bushy, Hypertension, CKD stage 3, Pafib, Moderate MR,  CHF (EF 40%), Thoracic AA, Copd,on home o2 2L [place] , lung cancer (per son), h/o pneumonia, presents with hypoxia. Pt's son states that pox 88%.  Pt notes slight chronic cough (nonproductive), chronic sob.   pt notes chronic intermittent chest pain all across the chest since at least Dec 2020.  Pt denies fever, chills, palp, n/v, abd pain, diarrhea, brbpr, black stool, dysuria, hematuria.   Pt was apparently given 2gm of magnesium, solumedrol 125mg  iv x1, and 0.3mg  of epi and a duoneb x1 en route    In ED,  T 96.8, P 95, R 27, Bp 160/80,  Pox 98% on NRB,   CXR  IMPRESSION: Widespread pneumonia favored, most severe in the right lower lobe. This could be bacterial or viral. Some component pulmonary edema/fluid overload could be present.  Na 140, K 3.7, Bun 27, Creatinine 1.81 Ast 33, Alt 14 Wbc 13.4, Hgb 12.9, Plt 347 BNP 69.5 Trop 112-> 698  Magnesium 3.5  ekg nsr at 70, nl axis, poor R progression , T inversion in v1,2  Pt will be admitted for acute hypoxic respiratory failure secondary to pneumonia, and elevated troponin likely secondary to hypoxia. And mild ARF  **Interim History Cardiology evaluated feel that he is not a candidate for further invasive work-up.  Continued to complain of shortness of breath and felt he had significant chest pressure  Yesterday and today.  He is coughing but not able to bring up productive sputum.  IV antibiotics have been changed from IV Levaquin to IV cefepime will be check an MRSA PCR test this was negative however have stopped the vancomycin.  Of note SARS coronavirus 2 is negative.  We will continue IV cefepime and can patient continues to have some shortness of breath.  We will  attempt to wean his supplemental oxygen and nasal cannula and IV can be discharged home tomorrow with hospice  Assessment & Plan:   Principal Problem:   Elevated troponin Active Problems:   Chest pain   Acute respiratory failure with hypoxia (HCC)   Pneumonia   Renal insufficiency   Shortness of breath   Protein-calorie malnutrition, severe   Do not resuscitate   Acute respiratory failure with hypoxia secondary to Hcap the setting of his metastatic lung cancer and possible concomitant CHF exacerbation -Check blood culture x2 -SARS-CoV-2 is negative via the PCR and his influenza panel is negative as well -SpO2: 99 % O2 Flow Rate (L/min): 3 L/min; continue to wean supplemental oxygen via nasal cannula -Urine strep antigen and Urine legionella antigen to be ordered and still pending -Changed IV Levaquin to IV cefepime and now will discontinue IV vancomycin given his negative MRSA PCR; likely can be changed to p.o. antibiotic in the a.m. -Received IV Lasix last night -Continue with breathing treatments as below and has albuterol as needed and I have scheduled Xopenex and Atrovent  -Continue with flutter valve, incentive spirometry as well as guaifenesin -Continue supplemental oxygen via nasal cannula and wean O2 as tolerated -Continuous pulse oximetry and maintain O2 saturations greater than 90% -We will do an ambulatory home O2 screen prior to discharge -cardiology does not know the chronicity of his reduced systolic function and they feel that he since he is  not a good candidate for invasive work-up given his metastatic lung cancer as well as his renal dysfunction there no further recommendations have signed off -If stable can likely be discharged home with hospice in a.m.  Chest pain and elevated troponin, likely secondary to malignancy ? -Pt 's Yolanda Bonine states had had for months.  -Troponin I was Elevated and trended up to 763 -Cardiology consulted and they have added ranolazine  as well as nitroglycerin and will continue his other medications including carvedilol; now they are discontinued for noticing --ECHO report as below -IV in 2019 which showed no ischemia -Patient continues to have some chest pressure and states that something will still like he was on his chest. -Cardiology evaluated and felt this was secondary to his pneumonia next-continue with treatment for pneumonia as above -Can likely be discharged home with hospice in a.m. if stable and changed to p.o. antibiotics and  AKI on CKD Stage 3 -Stopped Hydration with NS even his history of CHF -Lasix was reduced down to 20 mg p.o. daily given additional dose of IV Lasix yesterday given his crackles and respiratory distress last night -Patient's BUN/creatinine went from 28/1.81-> 39/1.74 -> 48/1.79 -Avoid nephrotoxic medications, contrast dyes as well as hypotension and renally adjust medications -Repeat CMP in the a.m.  Paroxysmal Atrial Fibrillation  -C/w Eliquis pharmacy to dose at 2.5 mg po BID  -Cont Carvedilol 25mg  po bid -C/w Telemetry [place] and currently is maintaining sinus rhythm  Acute on Chronic Combined Systolic and DiastolicCHF (EF 16%) -Contined Lasix 40mg  po qday this was changed to 20 mg po Daily by Cardiology -Had Dyspnea after IV Abx and will order stat CXR and Give Lasix x1 40 mg and have notified night team about response to intervention -Carvedilol as above at 25 mg po BID  -Strict I's and O's and Daily Weights -Cardiology decreased his Lasix to 20 mg p.o. daily as he appears euvolemic to dry this morning however later in the afternoon he became more dyspneic and had some crackles so was given a dose of IV Lasix -Repeat ECHO showed EF of 40 to 45% with grade 1 diastolic dysfunction -Continue to Monitor for S/Sx of Volume Overload  -Cardiology he is not a candidate for further invasive work-up given his metastatic lung cancer and renal dysfunction --1.75 L but continues to still  wear supplemental oxygen currently further -Cardiology signed off the case  GERD -C/w Protonix 40mg  po qday and with Pepcid 20mg  po qhs -Has Sucralfate   Gout -Decrease Allopurinol due to renal dysfunction 100 mg p.o. daily  Copd -Continue albuterol 2.5 mg nebs every 4 as needed for wheezing or shortness of breath along with -I have added scheduled Xopenex and Atrovent -Continue with montelukast 10 mg p.o. nightly -We will give him a flutter valve, incentive spirometer and guaifenesin -Continue supplemental oxygen via nasal cannula and wean O2 as tolerated  -Can likely change his cefepime to p.o. antibiotics in the a.m. and discharge home with hospice  Severe protein calorie malnutrition -Nutritionist consulted -Continue with Ensure Enlive p.o. 3 times daily as well as multivitamin with minerals daily as well as Magic cup 3 times daily with meals -encourage p.o. intake  Metastatic lung cancer with mets to the brain -Patient apparently does not know about his diagnosis and when I brought this up with him he did not know antibiotic and his grandson told me not to tell him as they are going to tell him in the [place] when they travel back to  the [place] with the rest of the family -Currently on hospice for this and hospice nurses following -Encouraged the family to tell the patient about his diagnosis -Continue with his morphine 7.5 mg p.o. 3 times daily as needed for severe pain -Likely be discharged home with hospice in a.m. if he is stable and his chest pain and shortness of breath is improving  Hyperlipidemia -Patient's total cholesterol/HDL ratio was 4.5, cholesterol level is 185, HDL was 41, LDL is 128, triglycerides were 78, and VLDL 16 -Defer to cardiology to start statin  Leukocytosis -In the setting of steroid demargination as he received a Medrol prior to coming to the hospital -Patient's WBC went from 11.0 and is now 15.4 next-antibiotics have changed as above  and have stopped Levaquin and added cefepime -Continue to monitor temperature curve and WBC count and for signs and symptoms of infection -WBC slightly worsening went from 15.4 now is 15.7 Continue monitor and trend  Macrocytic Anemia -Patient's hemoglobin/hematocrit went from 11.8/36.7 is now 11.6/34.9 -Check anemia panel showed an iron level of 72, TIBC 170, TIBC 242, saturation ratio 30%, ferritin level of 417, folate of 12.0, and vitamin B12 of 321 -Continue to monitor for signs and symptoms of bleeding; currently no overt bleeding noted -Repeat CBC in a.m.  Hypokalemia -In the setting of Lasix administered last night  -replete with p.o. potassium chloride 40 mill colons x1 -Continue monitor replete as necessary -repeat CMP in a.m.  GOC; DNR,poA and patient is currently on hospice but does not know his diagnosis of metastatic lung cancer to the brain  DVT prophylaxis: Anticoagulated with apixaban Code Status: DO NOT RESUSCITATE Family Communication: Discussed with family member who translated for me via the telephone Disposition Plan: Patient is from home and is currently on hospice but does not know his diagnosis of metastatic lung cancer to the brain; further disposition is pending improvement of his respiratory status and further work-up per cardiology; cardiology has no further interventions and we will likely discharge the patient home in the next 24 to 48 hours if he is stable and improved from his chest pain and shortness of breath status  Consultants:   Cardiology   Procedures:  ECHOCARDIOGRAM IMPRESSIONS    1. Left ventricular ejection fraction, by estimation, is 40 to 45%. The  left ventricle has mildly decreased function. The left ventricle  demonstrates global hypokinesis. Left ventricular diastolic parameters are  consistent with Grade I diastolic  dysfunction (impaired relaxation).  2. Right ventricular systolic function is normal. The right ventricular    size is normal.  3. The mitral valve is normal in structure and function. No evidence of  mitral valve regurgitation. No evidence of mitral stenosis.  4. The aortic valve is normal in structure and function. Aortic valve  regurgitation is mild. Mild aortic valve sclerosis is present, with no  evidence of aortic valve stenosis.  5. The inferior vena cava is normal in size with greater than 50%  respiratory variability, suggesting right atrial pressure of 3 mmHg.   Comparison(s): No prior Echocardiogram.   FINDINGS  Left Ventricle: Left ventricular ejection fraction, by estimation, is 40  to 45%. The left ventricle has mildly decreased function. The left  ventricle demonstrates global hypokinesis. The left ventricular internal  cavity size was normal in size. There is  no left ventricular hypertrophy. Left ventricular diastolic parameters  are consistent with Grade I diastolic dysfunction (impaired relaxation).  Normal left ventricular filling pressure.   Right Ventricle: The right ventricular  size is normal. No increase in  right ventricular wall thickness. Right ventricular systolic function is  normal.   Left Atrium: Left atrial size was normal in size.   Right Atrium: Right atrial size was normal in size.   Pericardium: There is no evidence of pericardial effusion.   Mitral Valve: The mitral valve is normal in structure and function. Normal  mobility of the mitral valve leaflets. No evidence of mitral valve  regurgitation. No evidence of mitral valve stenosis.   Tricuspid Valve: The tricuspid valve is normal in structure. Tricuspid  valve regurgitation is not demonstrated. No evidence of tricuspid  stenosis.   Aortic Valve: The aortic valve is normal in structure and function. Aortic  valve regurgitation is mild. Aortic regurgitation PHT measures 595 msec.  Mild aortic valve sclerosis is present, with no evidence of aortic valve  stenosis.   Pulmonic Valve: The  pulmonic valve was not well visualized. Pulmonic valve  regurgitation is not visualized. No evidence of pulmonic stenosis.   Aorta: The aortic root is normal in size and structure.   Venous: The inferior vena cava is normal in size with greater than 50%  respiratory variability, suggesting right atrial pressure of 3 mmHg.   IAS/Shunts: No atrial level shunt detected by color flow Doppler.     LEFT VENTRICLE  PLAX 2D  LVIDd:     4.35 cm Diastology  LVIDs:     3.04 cm LV e' lateral:  4.90 cm/s  LV PW:     1.05 cm LV E/e' lateral: 8.1  LV IVS:    1.08 cm LV e' medial:  4.57 cm/s  LVOT diam:   2.20 cm LV E/e' medial: 8.7  LV SV:     72.23 ml  LVOT Area:   3.80 cm     RIGHT VENTRICLE  RV Basal diam: 1.85 cm  RV S prime:   11.30 cm/s  TAPSE (M-mode): 2.0 cm   LEFT ATRIUM       RIGHT ATRIUM  LA diam:    2.70 cm RA Area:   11.00 cm  LA Vol (A2C):  26.6 ml RA Volume:  23.20 ml  LA Vol (A4C):  27.6 ml  LA Biplane Vol: 27.4 ml  AORTIC VALVE  LVOT Vmax:  90.10 cm/s  LVOT Vmean: 58.900 cm/s  LVOT VTI:  0.190 m  AI PHT:   595 msec    AORTA  Ao Root diam: 3.30 cm   MITRAL VALVE  MV Area (PHT): 2.82 cm  SHUNTS  MV Decel Time: 269 msec  Systemic VTI: 0.19 m  MV E velocity: 39.65 cm/s Systemic Diam: 2.20 cm  MV A velocity: 88.70 cm/s  MV E/A ratio: 0.45   Antimicrobials:  Anti-infectives (From admission, onward)   Start     Dose/Rate Route Frequency Ordered Stop   05/06/19 1600  ceFEPIme (MAXIPIME) 2 g in sodium chloride 0.9 % 100 mL IVPB     2 g 200 mL/hr over 30 Minutes Intravenous Every 24 hours 05/06/19 1403     05/05/19 0700  levofloxacin (LEVAQUIN) IVPB 500 mg  Status:  Discontinued     500 mg 100 mL/hr over 60 Minutes Intravenous Every 48 hours 05/05/19 0538 05/06/19 1400   05/05/19 0600  vancomycin (VANCOREADY) IVPB 750 mg/150 mL  Status:  Discontinued     750 mg 150 mL/hr over 60 Minutes  Intravenous Every 48 hours 05/05/19 0538 05/07/19 1347     Subjective: Seen and examined at bedside and and  I tried to get family to translate for him but they are not available but he is able to communicate somewhat and I asked if he is still having some chest pain and he said no but he states that he still having some shortness of breath.  Denies any lightheadedness or dizziness.  No other concerns or points at this time.  Objective: Vitals:   05/07/19 1102 05/07/19 1216 05/07/19 1651 05/07/19 1955  BP:   (!) 141/62   Pulse:      Resp:      Temp:  98.2 F (36.8 C) 97.9 F (36.6 C)   TempSrc:  Oral Oral   SpO2:    99%  Weight: 44.2 kg     Height: 4\' 11"  (1.499 m)       Intake/Output Summary (Last 24 hours) at 05/07/2019 2146 Last data filed at 05/07/2019 1723 Gross per 24 hour  Intake 360 ml  Output 1200 ml  Net -840 ml   Filed Weights   05/06/19 0400 05/07/19 0443 05/07/19 1102  Weight: 42.6 kg 41.2 kg 44.2 kg   Examination: Physical Exam:  Constitutional: Thin Filipino male currently no acute distress appears mildly anxious and did appear uncomfortable again slightly. Eyes: Lids and conjunctivae normal, sclerae anicteric  ENMT: External Ears, Nose appear normal. Grossly normal hearing. Mucous membranes are moist.  Neck: Appears normal, supple, no cervical masses, normal ROM, no appreciable thyromegaly; no appreciable JVD Respiratory: Diminished to auscultation bilaterally with coarse breath sounds and some mild rhonchi and crackles. Normal respiratory effort and patient is not tachypenic. No accessory muscle use.  Wearing 3 L of supplemental oxygen via nasal cannula and oxygen requirement is improving Cardiovascular: RRR, no murmurs / rubs / gallops. S1 and S2 auscultated. No extremity edema.  Abdomen: Soft, non-tender, non-distended. Bowel sounds positive.  GU: Deferred. Musculoskeletal: No clubbing / cyanosis of digits/nails. No joint deformity upper and lower  extremities.  Skin: No rashes, lesions, ulcers on limited skin evaluation. No induration; Warm and dry.  Neurologic: CN 2-12 grossly intact with no focal deficits. Romberg sign cerebellar reflexes not assessed.  Psychiatric: Normal judgment and insight. Alert and oriented x 3.  Slightly anxious mood and appropriate affect.   Data Reviewed: I have personally reviewed following labs and imaging studies  CBC: Recent Labs  Lab 05/04/19 2335 05/05/19 0508 05/06/19 0329 05/07/19 0504  WBC 13.4* 11.0* 15.4* 15.7*  NEUTROABS 5.7  --  13.9* 12.2*  HGB 12.9* 11.8* 10.9* 11.6*  HCT 39.8 36.7* 33.5* 34.9*  MCV 103.1* 102.5* 101.5* 99.7  PLT 347 271 276 786   Basic Metabolic Panel: Recent Labs  Lab 05/04/19 2335 05/05/19 0508 05/06/19 0329 05/07/19 0504  NA 140 139 143 143  K 3.7 3.9 3.8 3.4*  CL 99 103 105 106  CO2 27 23 24 27   GLUCOSE 170* 140* 146* 95  BUN 27* 28* 39* 48*  CREATININE 1.81* 1.81* 1.74* 1.79*  CALCIUM 9.0 8.6* 9.0 9.1  MG 3.5*  --  2.3 2.1  PHOS  --   --  3.3 3.3   GFR: Estimated Creatinine Clearance: 22.6 mL/min (A) (by C-G formula based on SCr of 1.79 mg/dL (H)). Liver Function Tests: Recent Labs  Lab 05/04/19 2335 05/05/19 0508 05/06/19 0329 05/07/19 0504  AST 33 24 19 34  ALT 14 13 13  32  ALKPHOS 70 55 55 59  BILITOT 0.9 0.7 0.6 0.8  PROT 6.5 6.0* 5.4* 6.0*  ALBUMIN 3.3* 2.9* 2.7* 3.0*   No  results for input(s): LIPASE, AMYLASE in the last 168 hours. No results for input(s): AMMONIA in the last 168 hours. Coagulation Profile: No results for input(s): INR, PROTIME in the last 168 hours. Cardiac Enzymes: Recent Labs  Lab 05/05/19 0147  CKTOTAL 50  CKMB 2.7   BNP (last 3 results) No results for input(s): PROBNP in the last 8760 hours. HbA1C: Recent Labs    05/05/19 1152  HGBA1C 6.2*   CBG: No results for input(s): GLUCAP in the last 168 hours. Lipid Profile: Recent Labs    05/05/19 0508  CHOL 185  HDL 41  LDLCALC 128*  TRIG 78    CHOLHDL 4.5   Thyroid Function Tests: No results for input(s): TSH, T4TOTAL, FREET4, T3FREE, THYROIDAB in the last 72 hours. Anemia Panel: Recent Labs    05/07/19 0504  VITAMINB12 321  FOLATE 12.0  FERRITIN 417*  TIBC 242*  IRON 72  RETICCTPCT 1.7   Sepsis Labs: No results for input(s): PROCALCITON, LATICACIDVEN in the last 168 hours.  Recent Results (from the past 240 hour(s))  Respiratory Panel by RT PCR (Flu A&B, Covid) - Nasopharyngeal Swab     Status: None   Collection Time: 05/05/19  2:58 AM   Specimen: Nasopharyngeal Swab  Result Value Ref Range Status   SARS Coronavirus 2 by RT PCR NEGATIVE NEGATIVE Final    Comment: (NOTE) SARS-CoV-2 target nucleic acids are NOT DETECTED. The SARS-CoV-2 RNA is generally detectable in upper respiratoy specimens during the acute phase of infection. The lowest concentration of SARS-CoV-2 viral copies this assay can detect is 131 copies/mL. A negative result does not preclude SARS-Cov-2 infection and should not be used as the sole basis for treatment or other patient management decisions. A negative result may occur with  improper specimen collection/handling, submission of specimen other than nasopharyngeal swab, presence of viral mutation(s) within the areas targeted by this assay, and inadequate number of viral copies (<131 copies/mL). A negative result must be combined with clinical observations, patient history, and epidemiological information. The expected result is Negative. Fact Sheet for Patients:  PinkCheek.be Fact Sheet for Healthcare Providers:  GravelBags.it This test is not yet ap proved or cleared by the [place] FDA and  has been authorized for detection and/or diagnosis of SARS-CoV-2 by FDA under an Emergency Use Authorization (EUA). This EUA will remain  in effect (meaning this test can be used) for the duration of the COVID-19 declaration under  Section 564(b)(1) of the Act, 21 U.S.C. section 360bbb-3(b)(1), unless the authorization is terminated or revoked sooner.    Influenza A by PCR NEGATIVE NEGATIVE Final   Influenza B by PCR NEGATIVE NEGATIVE Final    Comment: (NOTE) The Xpert Xpress SARS-CoV-2/FLU/RSV assay is intended as an aid in  the diagnosis of influenza from Nasopharyngeal swab specimens and  should not be used as a sole basis for treatment. Nasal washings and  aspirates are unacceptable for Xpert Xpress SARS-CoV-2/FLU/RSV  testing. Fact Sheet for Patients: PinkCheek.be Fact Sheet for Healthcare Providers: GravelBags.it This test is not yet approved or cleared by the [place] FDA and  has been authorized for detection and/or diagnosis of SARS-CoV-2 by  FDA under an Emergency Use Authorization (EUA). This EUA will remain  in effect (meaning this test can be used) for the duration of the  Covid-19 declaration under Section 564(b)(1) of the Act, 21  U.S.C. section 360bbb-3(b)(1), unless the authorization is  terminated or revoked. Performed at [facility], [place],  [place] [address]   Culture, blood (routine x 2)     Status: None (Preliminary result)   Collection Time: 05/05/19  4:35 AM   Specimen: BLOOD RIGHT WRIST  Result Value Ref Range Status   Specimen Description BLOOD RIGHT WRIST  Final   Special Requests   Final    BOTTLES DRAWN AEROBIC ONLY Blood Culture results may not be optimal due to an inadequate volume of blood received in culture bottles   Culture   Final    NO GROWTH 2 DAYS Performed at [facility], [place] [address], [place], [place] [address]    Report Status PENDING  Incomplete  Culture, blood (routine x 2)     Status: Abnormal (Preliminary result)   Collection Time: 05/05/19  5:08 AM   Specimen: BLOOD  Result Value Ref Range Status   Specimen Description BLOOD LEFT HAND  Final   Special Requests    Final    BOTTLES DRAWN AEROBIC AND ANAEROBIC Blood Culture results may not be optimal due to an inadequate volume of blood received in culture bottles   Culture  Setup Time   Final    GRAM POSITIVE COCCI IN CLUSTERS ANAEROBIC BOTTLE ONLY CRITICAL RESULT CALLED TO, READ BACK BY AND VERIFIED WITH: J. LEDFORD,PHARMD [id number] 05/06/2019 T. TYSOR    Culture (A)  Final    STAPHYLOCOCCUS SPECIES (COAGULASE NEGATIVE) THE SIGNIFICANCE OF ISOLATING THIS ORGANISM FROM A SINGLE SET OF BLOOD CULTURES WHEN MULTIPLE SETS ARE DRAWN IS UNCERTAIN. PLEASE NOTIFY THE MICROBIOLOGY DEPARTMENT WITHIN ONE WEEK IF SPECIATION AND SENSITIVITIES ARE REQUIRED. Performed at [facility], [place] [address], [place], [place] [address]    Report Status PENDING  Incomplete  MRSA PCR Screening     Status: None   Collection Time: 05/06/19 12:00 PM   Specimen: Nasal Mucosa; Nasopharyngeal  Result Value Ref Range Status   MRSA by PCR NEGATIVE NEGATIVE Final    Comment:        The GeneXpert MRSA Assay (FDA approved for NASAL specimens only), is one component of a comprehensive MRSA colonization surveillance program. It is not intended to diagnose MRSA infection nor to guide or monitor treatment for MRSA infections. Performed at [facility], [place] [address] [place], [place] [address]      RN Pressure Injury Documentation:     Estimated body mass index is 19.68 kg/m as calculated from the following:   Height as of this encounter: 4\' 11"  (1.499 m).   Weight as of this encounter: 44.2 kg.  Malnutrition Type:  Nutrition Problem: Severe Malnutrition Etiology: chronic illness(lung cancer)   Malnutrition Characteristics:  Signs/Symptoms: energy intake < or equal to 75% for > or equal to 1 month, moderate fat depletion, severe fat depletion, moderate muscle depletion, severe muscle depletion, percent weight loss Percent weight loss: 14.5 %   Nutrition Interventions:  Interventions: Ensure Enlive (each  supplement provides 350kcal and 20 grams of protein), MVI, Education    Radiology Studies: DG CHEST PORT 1 VIEW  Result Date: 05/07/2019 CLINICAL DATA:  Shortness of breath. EXAM: PORTABLE CHEST 1 VIEW COMPARISON:  05/06/2019. FINDINGS: Aneurysmal dilatation of the thoracic aorta cannot be excluded. Stable cardiomegaly. Persistent bilateral pulmonary infiltrates, most prominent in the right mid and lower lung. Slight interim improvement. Tiny bilateral pleural effusions cannot be excluded. No pneumothorax. Thoracic spine scoliosis and degenerative change. IMPRESSION: 1. Persistent bilateral pulmonary infiltrates, most prominent in the right mid and lower lung. Slight interim improvement. Tiny bilateral pleural effusion  noted. 2.  Cardiomegaly.  No pulmonary venous congestion. 3.  Aneurysmal dilatation of the thoracic aorta cannot be excluded. Electronically Signed   By: Marcello Moores  Register   On: 05/07/2019 08:57   DG CHEST PORT 1 VIEW  Result Date: 05/06/2019 CLINICAL DATA:  Shortness of breath. EXAM: PORTABLE CHEST 1 VIEW COMPARISON:  Single-view of the chest earlier today. FINDINGS: Extensive airspace disease in the right mid and lower lung zones is unchanged. The left lung remains clear. Heart size is upper normal. No pneumothorax. No acute bony abnormality. Marked scoliosis noted. IMPRESSION: No change in extensive airspace disease in the right mid and lower lung zones consistent with pneumonia. Electronically Signed   By: Inge Rise M.D.   On: 05/06/2019 19:27   DG CHEST PORT 1 VIEW  Result Date: 05/06/2019 CLINICAL DATA:  Shortness of breath. Additional provided: History of CHF, atrial fibrillation EXAM: PORTABLE CHEST 1 VIEW COMPARISON:  Chest radiograph 05/05/2019 FINDINGS: Unchanged mild cardiomegaly. Aortic atherosclerosis. Redemonstrated patchy bilateral airspace opacities most pronounced within the right middle and lower lobes. Small right pleural effusion. No evidence of pneumothorax.  Prominent thoracolumbar scoliosis. No acute bony abnormality. Overlying cardiac monitoring leads. IMPRESSION: No significant interval change as compared to chest radiograph 05/05/2019. Redemonstrated patchy bilateral airspace opacities most prominent within the right middle and lower lobes likely reflecting multifocal pneumonia. As before, a component of pulmonary edema could be present. Small right pleural effusion. Mild cardiomegaly with aortic atherosclerosis. Electronically Signed   By: Kellie Simmering DO   On: 05/06/2019 08:08   Scheduled Meds:  allopurinol  100 mg Oral Daily   apixaban  2.5 mg Oral BID   carvedilol  25 mg Oral BID WC   famotidine  20 mg Oral QHS   feeding supplement (ENSURE ENLIVE)  237 mL Oral TID BM   furosemide  20 mg Oral Daily   guaiFENesin  1,200 mg Oral BID   ipratropium  0.5 mg Nebulization BID   levalbuterol  0.63 mg Nebulization BID   mirtazapine  7.5 mg Oral QHS   montelukast  10 mg Oral QHS   multivitamin with minerals  1 tablet Oral Daily   pantoprazole  40 mg Oral Daily   sucralfate  1 g Oral TID WC   Continuous Infusions:  ceFEPime (MAXIPIME) IV 2 g (05/07/19 1558)    LOS: 2 days   Kerney Elbe, DO [facility] PAGER is on [place]  If 7PM-7AM, please contact night-coverage [email or web address]

## 2019-05-08 DIAGNOSIS — E876 Hypokalemia: Secondary | ICD-10-CM

## 2019-05-08 DIAGNOSIS — C3490 Malignant neoplasm of unspecified part of unspecified bronchus or lung: Secondary | ICD-10-CM

## 2019-05-08 DIAGNOSIS — I5043 Acute on chronic combined systolic (congestive) and diastolic (congestive) heart failure: Secondary | ICD-10-CM

## 2019-05-08 LAB — CULTURE, BLOOD (ROUTINE X 2)

## 2019-05-08 MED ORDER — METOPROLOL TARTRATE 50 MG PO TABS
50.0000 mg | ORAL_TABLET | Freq: Two times a day (BID) | ORAL | Status: DC
  Administered 2019-05-08 – 2019-05-09 (×2): 50 mg via ORAL
  Filled 2019-05-08: qty 2
  Filled 2019-05-08: qty 1

## 2019-05-08 MED ORDER — POTASSIUM CHLORIDE CRYS ER 20 MEQ PO TBCR
40.0000 meq | EXTENDED_RELEASE_TABLET | Freq: Once | ORAL | Status: AC
  Administered 2019-05-08: 40 meq via ORAL
  Filled 2019-05-08: qty 2

## 2019-05-08 NOTE — Progress Notes (Signed)
Pt c/o mid abd pain, feels like pressure on it, does make pt nauseated.  Zofran given, Gonfa, [place] notified, await for new order. Will continue to monitor.

## 2019-05-08 NOTE — Progress Notes (Signed)
PROGRESS NOTE  Cypher Paule SEG:[id number] DOB: 09/15/1945   PCP: Teodoro Kil, PA-C  Patient is from: Home  DOA: 05/04/2019 LOS: 3  Brief Narrative / Interim history: 74 year old man with history of GERD, HTN, CKD-3, paroxysmal A. fib, moderate MR, systolic CHF, thoracic AAA, COPD on 2 L by [place], lungs are with brain mets presenting with shortness of breath, dry cough and desaturation to 88%.  In ED, hemodynamically stable.  Tachypneic to 27.  98% on NRB.  CXR with widespread pneumonia and possible pulmonary edema.  Creatinine 1.81.  WBC 13.4.  BNP 69.  High-sensitivity troponin 112>> 698.  EKG NSR with PRWP.  Patient was admitted for acute hypoxic respiratory failure secondary to pneumonia, and elevated troponin.  Initially started on Levaquin and transition to cefepime.  Vancomycin discontinued after negative MRSA PCR.  Cardiology consulted for elevated troponin but felt he is not a candidate for further invasive work-up.  Subjective: No major events overnight or this morning.  Reports some improvement in his breathing but not quite back to baseline.  Complains of productive cough with whitish to yellowish phlegm.  He denies hemoptysis.  Denies chest pain, GI or UTI symptoms.  Video interpreter was used for this encounter.  Objective: Vitals:   05/08/19 0837 05/08/19 0905 05/08/19 0913 05/08/19 1508  BP:  109/61    Pulse:  95    Resp:  (!) 26    Temp:    97.7 F (36.5 C)  TempSrc:    Oral  SpO2: 97% 94% 95%   Weight:      Height:        Intake/Output Summary (Last 24 hours) at 05/08/2019 1637 Last data filed at 05/08/2019 1509 Gross per 24 hour  Intake 990 ml  Output 1270 ml  Net -280 ml   Filed Weights   05/07/19 0443 05/07/19 1102 05/08/19 0424  Weight: 41.2 kg 44.2 kg 41.3 kg    Examination:  GENERAL: Chronically appearing.  No apparent distress. HEENT: MMM.  Vision and hearing grossly intact.  NECK: Supple.  No apparent JVD.  RESP:  No IWOB.  Fair  aeration bilaterally.  Rhonchi bilaterally. CVS:  RRR. Heart sounds normal.  ABD/GI/GU: Bowel sounds present. Soft. Non tender.  MSK/EXT:  Moves extremities. No apparent deformity. No edema.  SKIN: no apparent skin lesion or wound NEURO: Awake, alert and oriented appropriately.  No apparent focal neuro deficit. PSYCH: Calm. Normal affect.  Procedures:  None  Assessment & Plan: Acute respiratory failure with hypoxia secondary to multifocal pneumonia, CHF exacerbation and metastatic lung cancer.  Saturating at 94% on 2 L by [place]. -Treat pneumonia and CHF as below. -Wean oxygen as able.  Multifocal pneumonia: CXR concerning.  COVID-19 PCR, blood culture, urine strep and Legionella antigen and MRSA PCR negative. -Levaquin and vancomycin 2/14-2/15 -Cefepime 2/15>> -Breathing treatments, incentive spirometry and flutter valve.  COPD exacerbation: Likely due to pneumonia. -Change Coreg to metoprolol-beta-1 selective -Breathing treatments -Wean oxygen as able  Acute on chronic combined CHF: Echo with EF of 40%, G1 DD.  About 1.2 L urine output/24 hours.  Appears euvolemic. -Cardiology signed off -On Lasix 20 mg daily per cardiology -Monitor fluid status  Paroxysmal A. fib: Rate controlled -Changed Coreg to metoprolol -On low-dose Eliquis for anticoagulation  Chest pain/elevated troponin: Likely demand ischemia in the setting of the above.  Could be due to underlying malignancy.  Echocardiogram as above. -Initially started on ranolazine by cardiology which was later discontinued. -No further work-up from cardiology standpoint  AKI on CKD-3A/azotemia: Creatinine 1.24 (on 02/27/2019)> 1.81 (admit)> 1.74 -Continue monitoring -Avoid nephrotoxic meds  Metastatic lung cancer with brain mets: Reportedly not aware of this diagnosis per family.  Followed by hospice. -Continue home morphine 7.5 mg 3 times daily  Macrocytic anemia: Likely anemia of chronic disease due to chronic kidney  disease and malignancy  -H&H stable.  Anemia panel consistent with anemia of chronic disease.  History of gout: Stable  Hypokalemia -Replenish and recheck  Leukocytosis -Continue monitoring  Abnormal blood culture: Coag negative staph in 1 out of 2 bottles-likely contaminant.  Ethical issue:  "Patient apparently does not know about his diagnosis and when I brought this up with him he did not know antibiotic and his grandson told me not to tell him as they are going to tell him in the [place] when they travel back to the [place] with the rest of the family" -Encouraged grandson to disclose this to patient.         Severe protein calorie malnutrition Nutrition Problem: Severe Malnutrition Etiology: chronic illness(lung cancer)  Signs/Symptoms: energy intake < or equal to 75% for > or equal to 1 month, moderate fat depletion, severe fat depletion, moderate muscle depletion, severe muscle depletion, percent weight loss Percent weight loss: 14.5 %  Interventions: Ensure Enlive (each supplement provides 350kcal and 20 grams of protein), MVI, Education   DVT prophylaxis: On Eliquis for A. fib. Code Status: DNR/DNI Family Communication: Updated patient's grandson over the phone.  Discharge barrier: Shortness of breath and productive cough Patient is from: Home Final disposition: Likely home with home hospice when medically stable.  Consultants: Cardiology   Microbiology summarized: Blood cultures negative COVID-19 negative Influenza PCR negative  Sch Meds:  Scheduled Meds: . allopurinol  100 mg Oral Daily  . apixaban  2.5 mg Oral BID  . famotidine  20 mg Oral QHS  . feeding supplement (ENSURE ENLIVE)  237 mL Oral TID BM  . furosemide  20 mg Oral Daily  . guaiFENesin  1,200 mg Oral BID  . ipratropium  0.5 mg Nebulization BID  . levalbuterol  0.63 mg Nebulization BID  . metoprolol tartrate  50 mg Oral BID  . mirtazapine  7.5 mg Oral QHS  . montelukast  10 mg  Oral QHS  . multivitamin with minerals  1 tablet Oral Daily  . pantoprazole  40 mg Oral Daily  . sucralfate  1 g Oral TID WC   Continuous Infusions: . ceFEPime (MAXIPIME) IV 2 g (05/07/19 1558)   PRN Meds:.albuterol, benzonatate, gabapentin, LORazepam, morphine, nitroGLYCERIN, ondansetron (ZOFRAN) IV  Antimicrobials: Anti-infectives (From admission, onward)   Start     Dose/Rate Route Frequency Ordered Stop   05/06/19 1600  ceFEPIme (MAXIPIME) 2 g in sodium chloride 0.9 % 100 mL IVPB     2 g 200 mL/hr over 30 Minutes Intravenous Every 24 hours 05/06/19 1403     05/05/19 0700  levofloxacin (LEVAQUIN) IVPB 500 mg  Status:  Discontinued     500 mg 100 mL/hr over 60 Minutes Intravenous Every 48 hours 05/05/19 0538 05/06/19 1400   05/05/19 0600  vancomycin (VANCOREADY) IVPB 750 mg/150 mL  Status:  Discontinued     750 mg 150 mL/hr over 60 Minutes Intravenous Every 48 hours 05/05/19 0538 05/07/19 1347       I have personally reviewed the following labs and images: CBC: Recent Labs  Lab 05/04/19 2335 05/05/19 0508 05/06/19 0329 05/07/19 0504  WBC 13.4* 11.0* 15.4* 15.7*  NEUTROABS 5.7  --  13.9* 12.2*  HGB 12.9* 11.8* 10.9* 11.6*  HCT 39.8 36.7* 33.5* 34.9*  MCV 103.1* 102.5* 101.5* 99.7  PLT 347 271 276 273   BMP &GFR Recent Labs  Lab 05/04/19 2335 05/05/19 0508 05/06/19 0329 05/07/19 0504  NA 140 139 143 143  K 3.7 3.9 3.8 3.4*  CL 99 103 105 106  CO2 27 23 24 27   GLUCOSE 170* 140* 146* 95  BUN 27* 28* 39* 48*  CREATININE 1.81* 1.81* 1.74* 1.79*  CALCIUM 9.0 8.6* 9.0 9.1  MG 3.5*  --  2.3 2.1  PHOS  --   --  3.3 3.3   Estimated Creatinine Clearance: 21.1 mL/min (A) (by C-G formula based on SCr of 1.79 mg/dL (H)). Liver & Pancreas: Recent Labs  Lab 05/04/19 2335 05/05/19 0508 05/06/19 0329 05/07/19 0504  AST 33 24 19 34  ALT 14 13 13  32  ALKPHOS 70 55 55 59  BILITOT 0.9 0.7 0.6 0.8  PROT 6.5 6.0* 5.4* 6.0*  ALBUMIN 3.3* 2.9* 2.7* 3.0*   No results  for input(s): LIPASE, AMYLASE in the last 168 hours. No results for input(s): AMMONIA in the last 168 hours. Diabetic: No results for input(s): HGBA1C in the last 72 hours. No results for input(s): GLUCAP in the last 168 hours. Cardiac Enzymes: Recent Labs  Lab 05/05/19 0147  CKTOTAL 50  CKMB 2.7   No results for input(s): PROBNP in the last 8760 hours. Coagulation Profile: No results for input(s): INR, PROTIME in the last 168 hours. Thyroid Function Tests: No results for input(s): TSH, T4TOTAL, FREET4, T3FREE, THYROIDAB in the last 72 hours. Lipid Profile: No results for input(s): CHOL, HDL, LDLCALC, TRIG, CHOLHDL, LDLDIRECT in the last 72 hours. Anemia Panel: Recent Labs    05/07/19 0504  VITAMINB12 321  FOLATE 12.0  FERRITIN 417*  TIBC 242*  IRON 72  RETICCTPCT 1.7   Urine analysis: No results found for: COLORURINE, APPEARANCEUR, LABSPEC, PHURINE, GLUCOSEU, HGBUR, BILIRUBINUR, KETONESUR, PROTEINUR, UROBILINOGEN, NITRITE, LEUKOCYTESUR Sepsis Labs: Invalid input(s): PROCALCITONIN, [place]  Microbiology: Recent Results (from the past 240 hour(s))  Respiratory Panel by RT PCR (Flu A&B, Covid) - Nasopharyngeal Swab     Status: None   Collection Time: 05/05/19  2:58 AM   Specimen: Nasopharyngeal Swab  Result Value Ref Range Status   SARS Coronavirus 2 by RT PCR NEGATIVE NEGATIVE Final    Comment: (NOTE) SARS-CoV-2 target nucleic acids are NOT DETECTED. The SARS-CoV-2 RNA is generally detectable in upper respiratoy specimens during the acute phase of infection. The lowest concentration of SARS-CoV-2 viral copies this assay can detect is 131 copies/mL. A negative result does not preclude SARS-Cov-2 infection and should not be used as the sole basis for treatment or other patient management decisions. A negative result may occur with  improper specimen collection/handling, submission of specimen other than nasopharyngeal swab, presence of viral mutation(s) within  the areas targeted by this assay, and inadequate number of viral copies (<131 copies/mL). A negative result must be combined with clinical observations, patient history, and epidemiological information. The expected result is Negative. Fact Sheet for Patients:  PinkCheek.be Fact Sheet for Healthcare Providers:  GravelBags.it This test is not yet ap proved or cleared by the [place] FDA and  has been authorized for detection and/or diagnosis of SARS-CoV-2 by FDA under an Emergency Use Authorization (EUA). This EUA will remain  in effect (meaning this test can be used) for the duration of the COVID-19 declaration under Section 564(b)(1) of the Act, 21  U.S.C. section 360bbb-3(b)(1), unless the authorization is terminated or revoked sooner.    Influenza A by PCR NEGATIVE NEGATIVE Final   Influenza B by PCR NEGATIVE NEGATIVE Final    Comment: (NOTE) The Xpert Xpress SARS-CoV-2/FLU/RSV assay is intended as an aid in  the diagnosis of influenza from Nasopharyngeal swab specimens and  should not be used as a sole basis for treatment. Nasal washings and  aspirates are unacceptable for Xpert Xpress SARS-CoV-2/FLU/RSV  testing. Fact Sheet for Patients: PinkCheek.be Fact Sheet for Healthcare Providers: GravelBags.it This test is not yet approved or cleared by the [place] FDA and  has been authorized for detection and/or diagnosis of SARS-CoV-2 by  FDA under an Emergency Use Authorization (EUA). This EUA will remain  in effect (meaning this test can be used) for the duration of the  Covid-19 declaration under Section 564(b)(1) of the Act, 21  U.S.C. section 360bbb-3(b)(1), unless the authorization is  terminated or revoked. Performed at [facility], [place] [address] [place], [place] [address]   Culture, blood (routine x 2)     Status: None (Preliminary result)    Collection Time: 05/05/19  4:35 AM   Specimen: BLOOD RIGHT WRIST  Result Value Ref Range Status   Specimen Description BLOOD RIGHT WRIST  Final   Special Requests   Final    BOTTLES DRAWN AEROBIC ONLY Blood Culture results may not be optimal due to an inadequate volume of blood received in culture bottles   Culture   Final    NO GROWTH 3 DAYS Performed at [facility], [place] [address], [place], [place] [address]    Report Status PENDING  Incomplete  Culture, blood (routine x 2)     Status: Abnormal   Collection Time: 05/05/19  5:08 AM   Specimen: BLOOD  Result Value Ref Range Status   Specimen Description BLOOD LEFT HAND  Final   Special Requests   Final    BOTTLES DRAWN AEROBIC AND ANAEROBIC Blood Culture results may not be optimal due to an inadequate volume of blood received in culture bottles   Culture  Setup Time   Final    GRAM POSITIVE COCCI IN CLUSTERS ANAEROBIC BOTTLE ONLY CRITICAL RESULT CALLED TO, READ BACK BY AND VERIFIED WITH: J. LEDFORD,PHARMD [id number] 05/06/2019 T. TYSOR    Culture (A)  Final    STAPHYLOCOCCUS SPECIES (COAGULASE NEGATIVE) THE SIGNIFICANCE OF ISOLATING THIS ORGANISM FROM A SINGLE SET OF BLOOD CULTURES WHEN MULTIPLE SETS ARE DRAWN IS UNCERTAIN. PLEASE NOTIFY THE MICROBIOLOGY DEPARTMENT WITHIN ONE WEEK IF SPECIATION AND SENSITIVITIES ARE REQUIRED. Performed at [facility], [place] [address] [place], [place] [address]    Report Status 05/08/2019 FINAL  Final  MRSA PCR Screening     Status: None   Collection Time: 05/06/19 12:00 PM   Specimen: Nasal Mucosa; Nasopharyngeal  Result Value Ref Range Status   MRSA by PCR NEGATIVE NEGATIVE Final    Comment:        The GeneXpert MRSA Assay (FDA approved for NASAL specimens only), is one component of a comprehensive MRSA colonization surveillance program. It is not intended to diagnose MRSA infection nor to guide or monitor treatment for MRSA infections. Performed at [facility],  [place] [address] [place], [place] [address]     Radiology Studies: No results found.   [place] Betty T. [place]  If 7PM-7AM, please contact night-coverage [email or web address] Password [facility] 05/08/2019, 4:37 PM

## 2019-05-09 DIAGNOSIS — J441 Chronic obstructive pulmonary disease with (acute) exacerbation: Secondary | ICD-10-CM

## 2019-05-09 DIAGNOSIS — Z789 Other specified health status: Secondary | ICD-10-CM

## 2019-05-09 DIAGNOSIS — I480 Paroxysmal atrial fibrillation: Secondary | ICD-10-CM

## 2019-05-09 LAB — RENAL FUNCTION PANEL
Albumin: 2.6 g/dL — ABNORMAL LOW (ref 3.5–5.0)
Anion gap: 7 (ref 5–15)
BUN: 30 mg/dL — ABNORMAL HIGH (ref 8–23)
CO2: 23 mmol/L (ref 22–32)
Calcium: 9 mg/dL (ref 8.9–10.3)
Chloride: 111 mmol/L (ref 98–111)
Creatinine, Ser: 1.34 mg/dL — ABNORMAL HIGH (ref 0.61–1.24)
GFR calc Af Amer: >60 mL/min (ref >60)
GFR calc non Af Amer: 52 mL/min — ABNORMAL LOW (ref >60)
Glucose, Bld: 110 mg/dL — ABNORMAL HIGH (ref 70–99)
Phosphorus: 3.5 mg/dL (ref 2.5–4.6)
Potassium: 4.6 mmol/L (ref 3.5–5.1)
Sodium: 141 mmol/L (ref 135–145)

## 2019-05-09 LAB — CBC
HCT: 34.8 % — ABNORMAL LOW (ref 39.0–52.0)
Hemoglobin: 11.5 g/dL — ABNORMAL LOW (ref 13.0–17.0)
MCH: 33.5 pg (ref 26.0–34.0)
MCHC: 33 g/dL (ref 30.0–36.0)
MCV: 101.5 fL — ABNORMAL HIGH (ref 80.0–100.0)
Platelets: 245 10*3/uL (ref 150–400)
RBC: 3.43 MIL/uL — ABNORMAL LOW (ref 4.22–5.81)
RDW: 15.1 % (ref 11.5–15.5)
WBC: 11.7 10*3/uL — ABNORMAL HIGH (ref 4.0–10.5)
nRBC: 0 % (ref 0.0–0.2)

## 2019-05-09 LAB — MAGNESIUM: Magnesium: 2 mg/dL (ref 1.7–2.4)

## 2019-05-09 MED ORDER — METOPROLOL SUCCINATE ER 100 MG PO TB24: 100.0000 mg | ORAL_TABLET | Freq: Every day | ORAL | 1 refills | Status: AC

## 2019-05-09 MED ORDER — ADULT MULTIVITAMIN W/MINERALS CH: 1.0000 | ORAL_TABLET | Freq: Every day | ORAL | 1 refills | Status: AC

## 2019-05-09 MED ORDER — LEVOFLOXACIN 500 MG PO TABS: 500.0000 mg | ORAL_TABLET | Freq: Every day | ORAL | 0 refills | Status: AC

## 2019-05-09 MED ORDER — FUROSEMIDE 20 MG PO TABS: 40.0000 mg | ORAL_TABLET | Freq: Every day | ORAL | 1 refills | Status: AC

## 2019-05-09 MED ORDER — ENSURE ENLIVE PO LIQD: 237.0000 mL | Freq: Three times a day (TID) | ORAL | 0 refills | Status: AC

## 2019-05-09 MED ORDER — APIXABAN 2.5 MG PO TABS: 2.5000 mg | ORAL_TABLET | Freq: Two times a day (BID) | ORAL | 1 refills | Status: AC

## 2019-05-09 MED FILL — FUROSEMIDE 40 MG TABLET: 40 | 30 days supply | Qty: 30 | Fill #0

## 2019-05-09 MED FILL — levoFLOXacin 500 MG TABS: 500 | 3 days supply | Qty: 3 | Fill #0

## 2019-05-09 MED FILL — METOPROLOL SUCCINATE ER 100: 100 | 30 days supply | Qty: 30 | Fill #0

## 2019-05-09 MED FILL — ELIQUIS 2.5 MG TABLET: 2.5 | 30 days supply | Qty: 60 | Fill #0

## 2019-05-09 NOTE — Discharge Summary (Signed)
Physician Discharge Summary  Ross Roberts JGO:[id number] DOB: 10/20/1945 DOA: 05/04/2019  PCP: Teodoro Kil, PA-C  Admit date: 05/04/2019 Discharge date: 05/09/2019  Admitted From: Home Disposition: Home  Recommendations for Outpatient Follow-up:  1. Follow ups as below. 2. Please obtain CBC/BMP/Mag at follow up 3. Please follow up on the following pending results: None  Home Health: Home hospice Equipment/Devices: None  Discharge Condition: Stable CODE STATUS: DNR/DNI  Follow-up Information    Teodoro Kil, PA-C. Schedule an appointment as soon as possible for a visit in 1 week(s).   Specialty: Physician Assistant Contact information: [place] [address] [place] Hospital Course: 74 year old man with history of GERD, HTN, CKD-3, paroxysmal A. fib, moderate MR, systolic CHF, thoracic AAA, COPD on 2 L by [place], metastatic lung cancer with brain mets presenting with shortness of breath, dry cough and desaturation to 88%.   In ED, hemodynamically stable.  Tachypneic to 27.  98% on NRB.  CXR with widespread pneumonia and possible pulmonary edema.  Creatinine 1.81.  WBC 13.4.  BNP 69.  High-sensitivity troponin 112>> 698.  EKG NSR with PRWP.  Patient was admitted for acute hypoxic respiratory failure secondary to pneumonia, and elevated troponin.  Initially started on Levaquin and transitioned to cefepime.  Vancomycin discontinued after negative MRSA PCR.  Cardiology consulted for elevated troponin but felt he is not a candidate for further invasive work-up.  Patient respiratory status improved.  Maintained appropriate saturation on room air.  He was discharged on p.o. Levaquin for 3 more days.  Home hospice to follow-up with patient.  Grandson and patient's daughter updated on discharge plan.   See individual problem list below for more on hospital course.  Discharge Diagnoses:  Acute respiratory failure with hypoxia  secondary to multifocal pneumonia, CHF exacerbation and metastatic lung cancer.    Normal saturation on room air. -Treat pneumonia and CHF as below. -Wean oxygen as able.  Multifocal pneumonia: CXR concerning.  COVID-19 PCR, blood culture, urine strep and Legionella antigen and MRSA PCR negative. -Levaquin and vancomycin 2/14-2/15 -Cefepime 2/15>> 2/18.  -Discharged on Levaquin for 3 more days.  COPD exacerbation: Likely due to pneumonia. -Change Coreg to metoprolol-beta-1 selective -Breathing treatments  Acute on chronic combined CHF: Echo with EF of 40%, G1 DD.  About 1.2 L urine output/24 hours.  Appears euvolemic. -Cardiology signed off -Discharged on p.o. Lasix 20 mg daily and metoprolol  Paroxysmal A. fib: Rate controlled -Changed Coreg to metoprolol-given history of COPD -On low-dose Eliquis for anticoagulation  Chest pain/elevated troponin: Likely demand ischemia in the setting of the above.  Could be due to underlying malignancy.  Echocardiogram as above. -Initially started on ranolazine by cardiology which was later discontinued. -No further work-up from cardiology standpoint   AKI on CKD-3A/azotemia: Creatinine 1.24 (on 02/27/2019)> 1.81 (admit)> 1.74> 1.34 -Recheck BMP at follow-up.  Metastatic lung cancer with brain mets: Recently diagnosed at [facility].  Patient is not aware of his diagnosis. Followed by hospice.  Encouraged family to disclose his diagnosis to patient. -Continue home morphine 7.5 mg 3 times daily  Macrocytic anemia: Likely anemia of chronic disease due to chronic kidney disease and malignancy.  H&H stable.  Anemia panel consistent with anemia of chronic disease. -Recheck CBC at follow-up.  History of gout: Stable  Hypokalemia: Resolved.  Leukocytosis: Improved.  Abnormal blood culture: Coag negative staph in 1 out of 2 bottles-likely contaminant.  Ethical issue: Patient with  recent diagnosis of lung cancer with brain mets  but not aware of his diagnosis. -Encouraged grandson and daughter to disclose his diagnosis -Hospice follow-up   Language barrier affecting communication and care -Video interpreter utilized.   Severe protein calorie malnutrition Nutrition Problem: Severe Malnutrition Etiology: chronic illness(lung cancer)  Signs/Symptoms: energy intake < or equal to 75% for > or equal to 1 month, moderate fat depletion, severe fat depletion, moderate muscle depletion, severe muscle depletion, percent weight loss Percent weight loss: 14.5 %  Interventions: Ensure Enlive (each supplement provides 350kcal and 20 grams of protein), MVI, Education   Discharge Instructions  Discharge Instructions    Call MD for:  difficulty breathing, headache or visual disturbances   Complete by: As directed    Call MD for:  extreme fatigue   Complete by: As directed    Call MD for:  persistant dizziness or light-headedness   Complete by: As directed    Diet general   Complete by: As directed    Discharge instructions   Complete by: As directed    It has been a pleasure taking care of you! You were hospitalized with shortness of breath, cough, and low oxygen likely due to pneumonia and COPD exacerbation. You were treated with antibiotics and breathing treatments.  With that, your symptoms improved to the point we think it is safe to let you go home and follow-up with your primary care doctor. Please review your new medication list and the directions before you take your medications.   Take care,   Increase activity slowly   Complete by: As directed      Allergies as of 05/09/2019      Reactions   Hydralazine Hcl Rash   Isosorbide Rash   Penicillins Rash   Tolerated ceftriaxone in [facility] at [facility]  Did it involve swelling of the face/tongue/throat, SOB, or low BP? Unknown Did it involve sudden or severe rash/hives, skin peeling, or any reaction on the inside of your mouth or nose? Yes Did you need to  seek medical attention at a hospital or doctor's office? Yes When did it last happen?2015 If all above answers are "NO", may proceed with cephalosporin use.      Medication List    STOP taking these medications   carvedilol 25 MG tablet Commonly known as: COREG     TAKE these medications   acetaminophen 650 MG CR tablet Commonly known as: TYLENOL Take 650 mg by mouth every 8 (eight) hours as needed for pain.   albuterol 108 (90 Base) MCG/ACT inhaler Commonly known as: VENTOLIN HFA Inhale into the lungs.   albuterol (2.5 MG/3ML) 0.083% nebulizer solution Commonly known as: PROVENTIL Take 2.5 mg by nebulization every 4 (four) hours as needed for wheezing or shortness of breath.   allopurinol 300 MG tablet Commonly known as: ZYLOPRIM Take 300 mg by mouth daily.   apixaban 2.5 MG Tabs tablet Commonly known as: ELIQUIS Take 1 tablet (2.5 mg total) by mouth 2 (two) times daily. What changed:   medication strength  how much to take   benzonatate 100 MG capsule Commonly known as: TESSALON Take 100 mg by mouth 3 (three) times daily as needed for cough.   betamethasone dipropionate 0.05 % cream Apply 1 applicator topically 2 (two) times daily as needed (itching).   famotidine 20 MG tablet Commonly known as: PEPCID Take 20 mg by mouth at bedtime.   feeding supplement (ENSURE ENLIVE) Liqd Take 237 mLs by mouth 3 (three) times daily  between meals.   furosemide 20 MG tablet Commonly known as: LASIX Take 2 tablets (40 mg total) by mouth daily. What changed: medication strength   gabapentin 100 MG capsule Commonly known as: NEURONTIN Take 100 mg by mouth at bedtime as needed (pain).   levofloxacin 500 MG tablet Commonly known as: Levaquin Take 1 tablet (500 mg total) by mouth daily for 3 days. Start taking on: May 10, 2019   LORazepam 0.5 MG tablet Commonly known as: ATIVAN Take 0.5 mg by mouth every 4 (four) hours as needed for anxiety.   metoprolol  succinate 100 MG 24 hr tablet Commonly known as: Toprol XL Take 1 tablet (100 mg total) by mouth daily. Take with or immediately following a meal.   mirtazapine 7.5 MG tablet Commonly known as: REMERON Take 7.5 mg by mouth at bedtime.   montelukast 10 MG tablet Commonly known as: SINGULAIR Take 10 mg by mouth at bedtime.   morphine 15 MG tablet Commonly known as: MSIR Take 7.5 mg by mouth 3 (three) times daily as needed.   multivitamin with minerals Tabs tablet Take 1 tablet by mouth daily.   nitroGLYCERIN 0.4 MG SL tablet Commonly known as: NITROSTAT Place 0.4 mg under the tongue every 5 (five) minutes as needed for chest pain.   pantoprazole 40 MG tablet Commonly known as: PROTONIX Take 40 mg by mouth daily.   sucralfate 1 GM/10ML suspension Commonly known as: CARAFATE Take 1 g by mouth 3 (three) times daily.       Consultations:  Cardiology  Procedures/Studies:  2D Echo on 05/05/2019 1. Left ventricular ejection fraction, by estimation, is 40 to 45%. The  left ventricle has mildly decreased function. The left ventricle  demonstrates global hypokinesis. Left ventricular diastolic parameters are  consistent with Grade I diastolic  dysfunction (impaired relaxation).  2. Right ventricular systolic function is normal. The right ventricular  size is normal.  3. The mitral valve is normal in structure and function. No evidence of  mitral valve regurgitation. No evidence of mitral stenosis.  4. The aortic valve is normal in structure and function. Aortic valve  regurgitation is mild. Mild aortic valve sclerosis is present, with no  evidence of aortic valve stenosis.  5. The inferior vena cava is normal in size with greater than 50%  respiratory variability, suggesting right atrial pressure of 3 mmHg.    DG CHEST PORT 1 VIEW  Result Date: 05/07/2019 CLINICAL DATA:  Shortness of breath. EXAM: PORTABLE CHEST 1 VIEW COMPARISON:  05/06/2019. FINDINGS: Aneurysmal  dilatation of the thoracic aorta cannot be excluded. Stable cardiomegaly. Persistent bilateral pulmonary infiltrates, most prominent in the right mid and lower lung. Slight interim improvement. Tiny bilateral pleural effusions cannot be excluded. No pneumothorax. Thoracic spine scoliosis and degenerative change. IMPRESSION: 1. Persistent bilateral pulmonary infiltrates, most prominent in the right mid and lower lung. Slight interim improvement. Tiny bilateral pleural effusion noted. 2.  Cardiomegaly.  No pulmonary venous congestion. 3.  Aneurysmal dilatation of the thoracic aorta cannot be excluded. Electronically Signed   By: Marcello Moores  Register   On: 05/07/2019 08:57   DG CHEST PORT 1 VIEW  Result Date: 05/06/2019 CLINICAL DATA:  Shortness of breath. EXAM: PORTABLE CHEST 1 VIEW COMPARISON:  Single-view of the chest earlier today. FINDINGS: Extensive airspace disease in the right mid and lower lung zones is unchanged. The left lung remains clear. Heart size is upper normal. No pneumothorax. No acute bony abnormality. Marked scoliosis noted. IMPRESSION: No change in extensive airspace  disease in the right mid and lower lung zones consistent with pneumonia. Electronically Signed   By: Inge Rise M.D.   On: 05/06/2019 19:27   DG CHEST PORT 1 VIEW  Result Date: 05/06/2019 CLINICAL DATA:  Shortness of breath. Additional provided: History of CHF, atrial fibrillation EXAM: PORTABLE CHEST 1 VIEW COMPARISON:  Chest radiograph 05/05/2019 FINDINGS: Unchanged mild cardiomegaly. Aortic atherosclerosis. Redemonstrated patchy bilateral airspace opacities most pronounced within the right middle and lower lobes. Small right pleural effusion. No evidence of pneumothorax. Prominent thoracolumbar scoliosis. No acute bony abnormality. Overlying cardiac monitoring leads. IMPRESSION: No significant interval change as compared to chest radiograph 05/05/2019. Redemonstrated patchy bilateral airspace opacities most prominent  within the right middle and lower lobes likely reflecting multifocal pneumonia. As before, a component of pulmonary edema could be present. Small right pleural effusion. Mild cardiomegaly with aortic atherosclerosis. Electronically Signed   By: Kellie Simmering DO   On: 05/06/2019 08:08   DG Chest Port 1 View  Result Date: 05/05/2019 CLINICAL DATA:  Shortness of breath EXAM: PORTABLE CHEST 1 VIEW COMPARISON:  None. FINDINGS: Artifact overlies the chest. Chronic scoliosis of the spine. Mild cardiomegaly. Aortic atherosclerosis and tortuosity. Abnormal bilateral patchy pulmonary density, most pronounced in the right lower lobe. Findings most consistent with widespread bronchopneumonia. This could be bacterial or viral. There is also some possibility the findings could reflect acute heart failure. IMPRESSION: Widespread pneumonia favored, most severe in the right lower lobe. This could be bacterial or viral. Some component pulmonary edema/fluid overload could be present. Electronically Signed   By: Nelson Chimes M.D.   On: 05/05/2019 00:39   ECHOCARDIOGRAM COMPLETE  Result Date: 05/05/2019    ECHOCARDIOGRAM REPORT   Patient Name:   Ross Roberts Date of Exam: 05/05/2019 Medical Rec #:  [id number]        Height: Accession #:    [id number]       Weight: Date of Birth:  December 28, 1945       BSA: Patient Age:    39 years         BP:           127/85 mmHg Patient Gender: M                HR:           76 bpm. Exam Location:  Inpatient Procedure: 2D Echo, Cardiac Doppler and Color Doppler Indications:    Chest pain  History:        Patient has no prior history of Echocardiogram examinations.                 CHF, COPD, Arrythmias:Atrial Fibrillation; Risk                 Factors:Hypertension. Lung cancer, pneumoina.  Sonographer:    Dustin Flock Referring Phys: [place]  1. Left ventricular ejection fraction, by estimation, is 40 to 45%. The left ventricle has mildly decreased function. The left  ventricle demonstrates global hypokinesis. Left ventricular diastolic parameters are consistent with Grade I diastolic dysfunction (impaired relaxation).  2. Right ventricular systolic function is normal. The right ventricular size is normal.  3. The mitral valve is normal in structure and function. No evidence of mitral valve regurgitation. No evidence of mitral stenosis.  4. The aortic valve is normal in structure and function. Aortic valve regurgitation is mild. Mild aortic valve sclerosis is present, with no evidence of aortic valve stenosis.  5. The inferior vena cava is  normal in size with greater than 50% respiratory variability, suggesting right atrial pressure of 3 mmHg. Comparison(s): No prior Echocardiogram. FINDINGS  Left Ventricle: Left ventricular ejection fraction, by estimation, is 40 to 45%. The left ventricle has mildly decreased function. The left ventricle demonstrates global hypokinesis. The left ventricular internal cavity size was normal in size. There is  no left ventricular hypertrophy. Left ventricular diastolic parameters are consistent with Grade I diastolic dysfunction (impaired relaxation). Normal left ventricular filling pressure. Right Ventricle: The right ventricular size is normal. No increase in right ventricular wall thickness. Right ventricular systolic function is normal. Left Atrium: Left atrial size was normal in size. Right Atrium: Right atrial size was normal in size. Pericardium: There is no evidence of pericardial effusion. Mitral Valve: The mitral valve is normal in structure and function. Normal mobility of the mitral valve leaflets. No evidence of mitral valve regurgitation. No evidence of mitral valve stenosis. Tricuspid Valve: The tricuspid valve is normal in structure. Tricuspid valve regurgitation is not demonstrated. No evidence of tricuspid stenosis. Aortic Valve: The aortic valve is normal in structure and function. Aortic valve regurgitation is mild. Aortic  regurgitation PHT measures 595 msec. Mild aortic valve sclerosis is present, with no evidence of aortic valve stenosis. Pulmonic Valve: The pulmonic valve was not well visualized. Pulmonic valve regurgitation is not visualized. No evidence of pulmonic stenosis. Aorta: The aortic root is normal in size and structure. Venous: The inferior vena cava is normal in size with greater than 50% respiratory variability, suggesting right atrial pressure of 3 mmHg. IAS/Shunts: No atrial level shunt detected by color flow Doppler.  LEFT VENTRICLE PLAX 2D LVIDd:         4.35 cm  Diastology LVIDs:         3.04 cm  LV e' lateral:   4.90 cm/s LV PW:         1.05 cm  LV E/e' lateral: 8.1 LV IVS:        1.08 cm  LV e' medial:    4.57 cm/s LVOT diam:     2.20 cm  LV E/e' medial:  8.7 LV SV:         72.23 ml LVOT Area:     3.80 cm  RIGHT VENTRICLE RV Basal diam:  1.85 cm RV S prime:     11.30 cm/s TAPSE (M-mode): 2.0 cm LEFT ATRIUM             RIGHT ATRIUM LA diam:        2.70 cm RA Area:     11.00 cm LA Vol (A2C):   26.6 ml RA Volume:   23.20 ml LA Vol (A4C):   27.6 ml LA Biplane Vol: 27.4 ml  AORTIC VALVE LVOT Vmax:   90.10 cm/s LVOT Vmean:  58.900 cm/s LVOT VTI:    0.190 m AI PHT:      595 msec  AORTA Ao Root diam: 3.30 cm MITRAL VALVE MV Area (PHT): 2.82 cm    SHUNTS MV Decel Time: 269 msec    Systemic VTI:  0.19 m MV E velocity: 39.65 cm/s  Systemic Diam: 2.20 cm MV A velocity: 88.70 cm/s MV E/A ratio:  0.45 Mihai Croitoru MD Electronically signed by Sanda Klein MD Signature Date/Time: 05/05/2019/2:02:08 PM    Final        Discharge Exam: Vitals:   05/09/19 0000 05/09/19 0500  BP:  (!) 123/57  Pulse:  72  Resp: 19 16  Temp:  98.1 F (36.7  C)  SpO2:  98%    GENERAL: No acute distress.  Appears well.  HEENT: MMM.  Vision and hearing grossly intact.  NECK: Supple.  No apparent JVD.  RESP:  No IWOB.  Fair aeration bilaterally. CVS:  RRR. Heart sounds normal.  ABD/GI/GU: Bowel sounds present. Soft. Non tender.   MSK/EXT:  Moves extremities. No apparent deformity or edema.  SKIN: no apparent skin lesion or wound NEURO: Awake, alert but does not want to talk.  No apparent focal neuro deficit. PSYCH: Calm. Normal affect.    The results of significant diagnostics from this hospitalization (including imaging, microbiology, ancillary and laboratory) are listed below for reference.     Microbiology: Recent Results (from the past 240 hour(s))  Respiratory Panel by RT PCR (Flu A&B, Covid) - Nasopharyngeal Swab     Status: None   Collection Time: 05/05/19  2:58 AM   Specimen: Nasopharyngeal Swab  Result Value Ref Range Status   SARS Coronavirus 2 by RT PCR NEGATIVE NEGATIVE Final    Comment: (NOTE) SARS-CoV-2 target nucleic acids are NOT DETECTED. The SARS-CoV-2 RNA is generally detectable in upper respiratoy specimens during the acute phase of infection. The lowest concentration of SARS-CoV-2 viral copies this assay can detect is 131 copies/mL. A negative result does not preclude SARS-Cov-2 infection and should not be used as the sole basis for treatment or other patient management decisions. A negative result may occur with  improper specimen collection/handling, submission of specimen other than nasopharyngeal swab, presence of viral mutation(s) within the areas targeted by this assay, and inadequate number of viral copies (<131 copies/mL). A negative result must be combined with clinical observations, patient history, and epidemiological information. The expected result is Negative. Fact Sheet for Patients:  PinkCheek.be Fact Sheet for Healthcare Providers:  GravelBags.it This test is not yet ap proved or cleared by the [place] FDA and  has been authorized for detection and/or diagnosis of SARS-CoV-2 by FDA under an Emergency Use Authorization (EUA). This EUA will remain  in effect (meaning this test can be used) for the duration  of the COVID-19 declaration under Section 564(b)(1) of the Act, 21 U.S.C. section 360bbb-3(b)(1), unless the authorization is terminated or revoked sooner.    Influenza A by PCR NEGATIVE NEGATIVE Final   Influenza B by PCR NEGATIVE NEGATIVE Final    Comment: (NOTE) The Xpert Xpress SARS-CoV-2/FLU/RSV assay is intended as an aid in  the diagnosis of influenza from Nasopharyngeal swab specimens and  should not be used as a sole basis for treatment. Nasal washings and  aspirates are unacceptable for Xpert Xpress SARS-CoV-2/FLU/RSV  testing. Fact Sheet for Patients: PinkCheek.be Fact Sheet for Healthcare Providers: GravelBags.it This test is not yet approved or cleared by the [place] FDA and  has been authorized for detection and/or diagnosis of SARS-CoV-2 by  FDA under an Emergency Use Authorization (EUA). This EUA will remain  in effect (meaning this test can be used) for the duration of the  Covid-19 declaration under Section 564(b)(1) of the Act, 21  U.S.C. section 360bbb-3(b)(1), unless the authorization is  terminated or revoked. Performed at [facility], [place] [address] [place], [place] [address]   Culture, blood (routine x 2)     Status: None (Preliminary result)   Collection Time: 05/05/19  4:35 AM   Specimen: BLOOD RIGHT WRIST  Result Value Ref Range Status   Specimen Description BLOOD RIGHT WRIST  Final   Special Requests   Final  BOTTLES DRAWN AEROBIC ONLY Blood Culture results may not be optimal due to an inadequate volume of blood received in culture bottles   Culture   Final    NO GROWTH 4 DAYS Performed at [facility], [place] [address], [place], [place] [address]    Report Status PENDING  Incomplete  Culture, blood (routine x 2)     Status: Abnormal   Collection Time: 05/05/19  5:08 AM   Specimen: BLOOD  Result Value Ref Range Status   Specimen Description BLOOD LEFT HAND  Final    Special Requests   Final    BOTTLES DRAWN AEROBIC AND ANAEROBIC Blood Culture results may not be optimal due to an inadequate volume of blood received in culture bottles   Culture  Setup Time   Final    GRAM POSITIVE COCCI IN CLUSTERS ANAEROBIC BOTTLE ONLY CRITICAL RESULT CALLED TO, READ BACK BY AND VERIFIED WITH: J. LEDFORD,PHARMD [id number] 05/06/2019 T. TYSOR    Culture (A)  Final    STAPHYLOCOCCUS SPECIES (COAGULASE NEGATIVE) THE SIGNIFICANCE OF ISOLATING THIS ORGANISM FROM A SINGLE SET OF BLOOD CULTURES WHEN MULTIPLE SETS ARE DRAWN IS UNCERTAIN. PLEASE NOTIFY THE MICROBIOLOGY DEPARTMENT WITHIN ONE WEEK IF SPECIATION AND SENSITIVITIES ARE REQUIRED. Performed at [facility], [place] [address] [place], [place] [address]    Report Status 05/08/2019 FINAL  Final  MRSA PCR Screening     Status: None   Collection Time: 05/06/19 12:00 PM   Specimen: Nasal Mucosa; Nasopharyngeal  Result Value Ref Range Status   MRSA by PCR NEGATIVE NEGATIVE Final    Comment:        The GeneXpert MRSA Assay (FDA approved for NASAL specimens only), is one component of a comprehensive MRSA colonization surveillance program. It is not intended to diagnose MRSA infection nor to guide or monitor treatment for MRSA infections. Performed at [facility], [place] [address] [place], [place] [address]      Labs: BNP (last 3 results) Recent Labs    05/04/19 2335  BNP 29.5   Basic Metabolic Panel: Recent Labs  Lab 05/04/19 2335 05/05/19 0508 05/06/19 0329 05/07/19 0504 05/09/19 0435  NA 140 139 143 143 141  K 3.7 3.9 3.8 3.4* 4.6  CL 99 103 105 106 111  CO2 27 23 24 27 23   GLUCOSE 170* 140* 146* 95 110*  BUN 27* 28* 39* 48* 30*  CREATININE 1.81* 1.81* 1.74* 1.79* 1.34*  CALCIUM 9.0 8.6* 9.0 9.1 9.0  MG 3.5*  --  2.3 2.1 2.0  PHOS  --   --  3.3 3.3 3.5   Liver Function Tests: Recent Labs  Lab 05/04/19 2335 05/05/19 0508 05/06/19 0329 05/07/19 0504 05/09/19 0435  AST 33 24 19 34  --    ALT 14 13 13  32  --   ALKPHOS 70 55 55 59  --   BILITOT 0.9 0.7 0.6 0.8  --   PROT 6.5 6.0* 5.4* 6.0*  --   ALBUMIN 3.3* 2.9* 2.7* 3.0* 2.6*   No results for input(s): LIPASE, AMYLASE in the last 168 hours. No results for input(s): AMMONIA in the last 168 hours. CBC: Recent Labs  Lab 05/04/19 2335 05/05/19 0508 05/06/19 0329 05/07/19 0504 05/09/19 0435  WBC 13.4* 11.0* 15.4* 15.7* 11.7*  NEUTROABS 5.7  --  13.9* 12.2*  --   HGB 12.9* 11.8* 10.9* 11.6* 11.5*  HCT 39.8 36.7* 33.5* 34.9* 34.8*  MCV 103.1* 102.5* 101.5* 99.7 101.5*  PLT 347  271 276 273 245   Cardiac Enzymes: Recent Labs  Lab 05/05/19 0147  CKTOTAL 50  CKMB 2.7   BNP: Invalid input(s): POCBNP CBG: No results for input(s): GLUCAP in the last 168 hours. D-Dimer No results for input(s): DDIMER in the last 72 hours. Hgb A1c No results for input(s): HGBA1C in the last 72 hours. Lipid Profile No results for input(s): CHOL, HDL, LDLCALC, TRIG, CHOLHDL, LDLDIRECT in the last 72 hours. Thyroid function studies No results for input(s): TSH, T4TOTAL, T3FREE, THYROIDAB in the last 72 hours.  Invalid input(s): FREET3 Anemia work up Recent Labs    05/07/19 0504  VITAMINB12 321  FOLATE 12.0  FERRITIN 417*  TIBC 242*  IRON 72  RETICCTPCT 1.7   Urinalysis No results found for: COLORURINE, APPEARANCEUR, LABSPEC, [place], GLUCOSEU, [place], [place], KETONESUR, [place], UROBILINOGEN, NITRITE, LEUKOCYTESUR Sepsis Labs Invalid input(s): PROCALCITONIN,  WBC,  LACTICIDVEN   Video interpreter with ID number [id number] was used for this encounter  Time coordinating discharge: 35 minutes  SIGNED:  Mercy Riding, MD  [facility] 05/09/2019, 8:39 AM  If 7PM-7AM, please contact night-coverage [email or web address] Password [email or web address]

## 2019-05-09 NOTE — Care Management (Signed)
05-09-19 1017 Patient is currently active with [facility]. Plan will be to transition home today. [facility] is aware of patient's transition home. Case Manager did speak with grandson and he will transport patient home via private vehicle. Grandson to bring portable oxygen tank for travel home- per grandson he will arrive around noon today. No further needs from Case Manager at this time. Bethena Roys, RN,BSN Case Manager [phone]

## 2019-05-10 LAB — CULTURE, BLOOD (ROUTINE X 2): Culture: NO GROWTH

## 2019-06-20 DEATH — deceased

## 2020-10-10 IMAGING — DX DG CHEST 1V PORT
1 series · 1 of 1 positions shown · non-contrast
Comparison: Single-view of the chest earlier today.

CLINICAL DATA: Shortness of breath.

EXAM:
PORTABLE CHEST 1 VIEW

[chest ap]
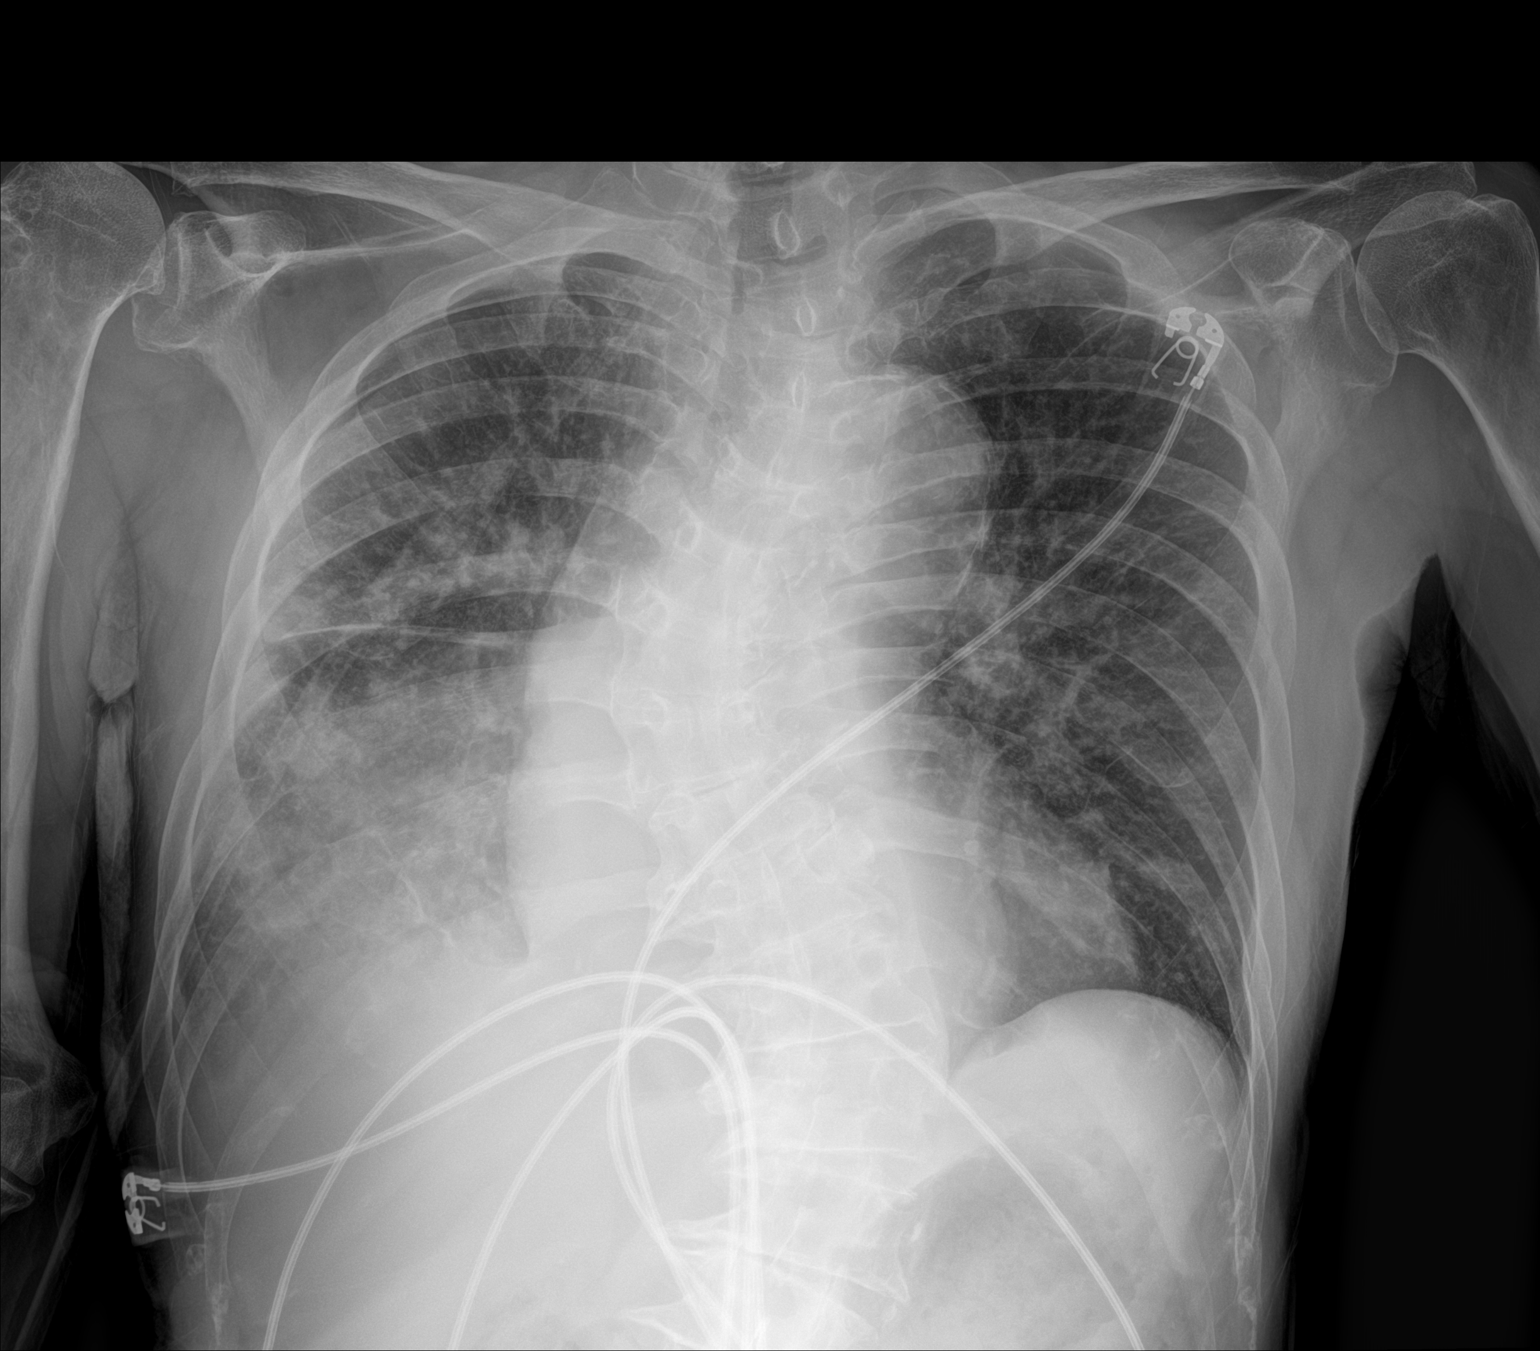

[1 of 1 positions shown; findings below may reference images not displayed]

FINDINGS: Extensive airspace disease in the right mid and lower lung zones is
unchanged. The left lung remains clear. Heart size is upper normal.
No pneumothorax. No acute bony abnormality. Marked scoliosis noted.
IMPRESSION: No change in extensive airspace disease in the right mid and lower
lung zones consistent with pneumonia.

## 2020-10-10 IMAGING — DX DG CHEST 1V PORT
1 series · 1 of 1 positions shown · non-contrast
Comparison: Chest radiograph 05/05/2019

CLINICAL DATA: Shortness of breath. Additional provided: History of
CHF, atrial fibrillation

EXAM:
PORTABLE CHEST 1 VIEW

[chest]
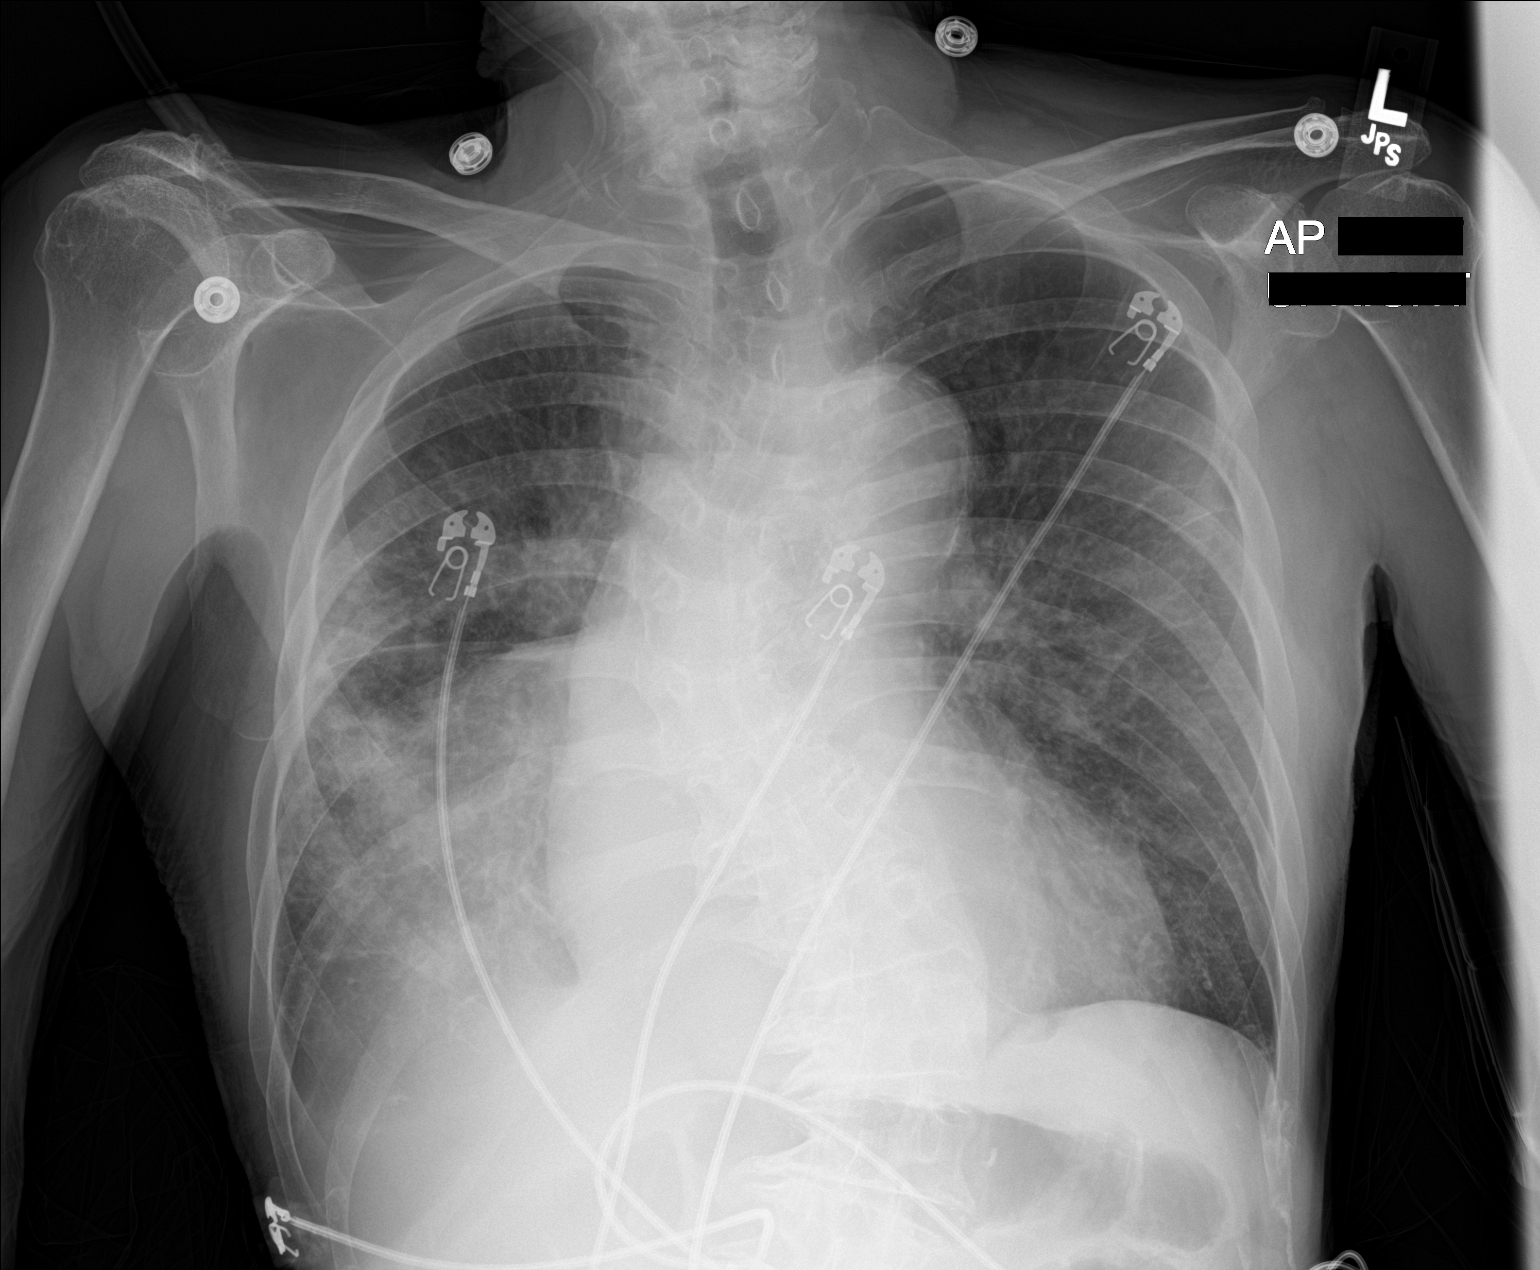

[1 of 1 positions shown; findings below may reference images not displayed]

FINDINGS: Unchanged mild cardiomegaly. Aortic atherosclerosis. Redemonstrated
patchy bilateral airspace opacities most pronounced within the right
middle and lower lobes. Small right pleural effusion. No evidence of
pneumothorax. Prominent thoracolumbar scoliosis. No acute bony
abnormality. Overlying cardiac monitoring leads.
IMPRESSION: No significant interval change as compared to chest radiograph
05/05/2019.

Redemonstrated patchy bilateral airspace opacities most prominent
within the right middle and lower lobes likely reflecting multifocal
pneumonia. As before, a component of pulmonary edema could be
present.

Small right pleural effusion.

Mild cardiomegaly with aortic atherosclerosis.
